# Patient Record
Sex: Male | Born: 1950 | Race: White | Hispanic: No | Marital: Single | Smoking: Former smoker
Health system: Southern US, Community
[De-identification: ages and names within clinical notes are randomized; demographics above are authoritative.]

## PROBLEM LIST (undated history)

## (undated) DIAGNOSIS — F419 Anxiety disorder, unspecified: Secondary | ICD-10-CM

## (undated) DIAGNOSIS — F32A Depression, unspecified: Secondary | ICD-10-CM

## (undated) DIAGNOSIS — F845 Asperger's syndrome: Secondary | ICD-10-CM

## (undated) DIAGNOSIS — F329 Major depressive disorder, single episode, unspecified: Secondary | ICD-10-CM

## (undated) DIAGNOSIS — J302 Other seasonal allergic rhinitis: Secondary | ICD-10-CM

## (undated) DIAGNOSIS — M1990 Unspecified osteoarthritis, unspecified site: Secondary | ICD-10-CM

## (undated) DIAGNOSIS — K5190 Ulcerative colitis, unspecified, without complications: Secondary | ICD-10-CM

## (undated) DIAGNOSIS — T7840XA Allergy, unspecified, initial encounter: Secondary | ICD-10-CM

## (undated) DIAGNOSIS — G4700 Insomnia, unspecified: Secondary | ICD-10-CM

## (undated) DIAGNOSIS — H269 Unspecified cataract: Secondary | ICD-10-CM

## (undated) HISTORY — PX: WRIST FRACTURE SURGERY: SHX121

## (undated) HISTORY — DX: Allergy, unspecified, initial encounter: T78.40XA

## (undated) HISTORY — DX: Anxiety disorder, unspecified: F41.9

## (undated) HISTORY — DX: Insomnia, unspecified: G47.00

## (undated) HISTORY — PX: POLYPECTOMY: SHX149

## (undated) HISTORY — PX: TONSILLECTOMY: SUR1361

## (undated) HISTORY — PX: COLONOSCOPY: SHX174

## (undated) HISTORY — DX: Other seasonal allergic rhinitis: J30.2

## (undated) HISTORY — DX: Major depressive disorder, single episode, unspecified: F32.9

## (undated) HISTORY — DX: Unspecified osteoarthritis, unspecified site: M19.90

## (undated) HISTORY — DX: Ulcerative colitis, unspecified, without complications: K51.90

## (undated) HISTORY — PX: HERNIA REPAIR: SHX51

## (undated) HISTORY — DX: Asperger's syndrome: F84.5

## (undated) HISTORY — DX: Depression, unspecified: F32.A

## (undated) HISTORY — DX: Unspecified cataract: H26.9

---

## 2004-08-05 ENCOUNTER — Ambulatory Visit: Payer: Self-pay | Admitting: Gastroenterology

## 2009-02-01 ENCOUNTER — Ambulatory Visit: Payer: Self-pay | Admitting: Gastroenterology

## 2009-05-19 ENCOUNTER — Emergency Department (HOSPITAL_COMMUNITY): Admission: EM | Admit: 2009-05-19 | Discharge: 2009-05-19 | Payer: Self-pay | Admitting: Emergency Medicine

## 2014-09-11 ENCOUNTER — Ambulatory Visit (INDEPENDENT_AMBULATORY_CARE_PROVIDER_SITE_OTHER): Payer: BC Managed Care – PPO

## 2014-09-11 ENCOUNTER — Ambulatory Visit (INDEPENDENT_AMBULATORY_CARE_PROVIDER_SITE_OTHER): Payer: BC Managed Care – PPO | Admitting: Podiatry

## 2014-09-11 ENCOUNTER — Encounter: Payer: Self-pay | Admitting: Podiatry

## 2014-09-11 VITALS — BP 129/84 | HR 77 | Resp 12

## 2014-09-11 DIAGNOSIS — M79672 Pain in left foot: Secondary | ICD-10-CM

## 2014-09-11 DIAGNOSIS — M722 Plantar fascial fibromatosis: Secondary | ICD-10-CM

## 2014-09-11 MED ORDER — TRIAMCINOLONE ACETONIDE 10 MG/ML IJ SUSP: 10.0000 mg | Freq: Once | INTRAMUSCULAR | Status: DC

## 2014-09-11 NOTE — Patient Instructions (Signed)
Plantar Fasciitis (Heel Spur Syndrome) with Rehab The plantar fascia is a fibrous, ligament-like, soft-tissue structure that spans the bottom of the foot. Plantar fasciitis is a condition that causes pain in the foot due to inflammation of the tissue. SYMPTOMS   Pain and tenderness on the underneath side of the foot.  Pain that worsens with standing or walking. CAUSES  Plantar fasciitis is caused by irritation and injury to the plantar fascia on the underneath side of the foot. Common mechanisms of injury include:  Direct trauma to bottom of the foot.  Damage to a small nerve that runs under the foot where the main fascia attaches to the heel bone.  Stress placed on the plantar fascia due to bone spurs. RISK INCREASES WITH:   Activities that place stress on the plantar fascia (running, jumping, pivoting, or cutting).  Poor strength and flexibility.  Improperly fitted shoes.  Tight calf muscles.  Flat feet.  Failure to warm-up properly before activity.  Obesity. PREVENTION  Warm up and stretch properly before activity.  Allow for adequate recovery between workouts.  Maintain physical fitness:  Strength, flexibility, and endurance.  Cardiovascular fitness.  Maintain a health body weight.  Avoid stress on the plantar fascia.  Wear properly fitted shoes, including arch supports for individuals who have flat feet. PROGNOSIS  If treated properly, then the symptoms of plantar fasciitis usually resolve without surgery. However, occasionally surgery is necessary. RELATED COMPLICATIONS   Recurrent symptoms that may result in a chronic condition.  Problems of the lower back that are caused by compensating for the injury, such as limping.  Pain or weakness of the foot during push-off following surgery.  Chronic inflammation, scarring, and partial or complete fascia tear, occurring more often from repeated injections. TREATMENT  Treatment initially involves the use of  ice and medication to help reduce pain and inflammation. The use of strengthening and stretching exercises may help reduce pain with activity, especially stretches of the Achilles tendon. These exercises may be performed at home or with a therapist. Your caregiver may recommend that you use heel cups of arch supports to help reduce stress on the plantar fascia. Occasionally, corticosteroid injections are given to reduce inflammation. If symptoms persist for greater than 6 months despite non-surgical (conservative), then surgery may be recommended.  MEDICATION   If pain medication is necessary, then nonsteroidal anti-inflammatory medications, such as aspirin and ibuprofen, or other minor pain relievers, such as acetaminophen, are often recommended.  Do not take pain medication within 7 days before surgery.  Prescription pain relievers may be given if deemed necessary by your caregiver. Use only as directed and only as much as you need.  Corticosteroid injections may be given by your caregiver. These injections should be reserved for the most serious cases, because they may only be given a certain number of times. HEAT AND COLD  Cold treatment (icing) relieves pain and reduces inflammation. Cold treatment should be applied for 10 to 15 minutes every 2 to 3 hours for inflammation and pain and immediately after any activity that aggravates your symptoms. Use ice packs or massage the area with a piece of ice (ice massage).  Heat treatment may be used prior to performing the stretching and strengthening activities prescribed by your caregiver, physical therapist, or athletic trainer. Use a heat pack or soak the injury in warm water. SEEK IMMEDIATE MEDICAL CARE IF:  Treatment seems to offer no benefit, or the condition worsens.  Any medications produce adverse side effects. EXERCISES RANGE   OF MOTION (ROM) AND STRETCHING EXERCISES - Plantar Fasciitis (Heel Spur Syndrome) These exercises may help you  when beginning to rehabilitate your injury. Your symptoms may resolve with or without further involvement from your physician, physical therapist or athletic trainer. While completing these exercises, remember:   Restoring tissue flexibility helps normal motion to return to the joints. This allows healthier, less painful movement and activity.  An effective stretch should be held for at least 30 seconds.  A stretch should never be painful. You should only feel a gentle lengthening or release in the stretched tissue. RANGE OF MOTION - Toe Extension, Flexion  Sit with your right / left leg crossed over your opposite knee.  Grasp your toes and gently pull them back toward the top of your foot. You should feel a stretch on the bottom of your toes and/or foot.  Hold this stretch for __________ seconds.  Now, gently pull your toes toward the bottom of your foot. You should feel a stretch on the top of your toes and or foot.  Hold this stretch for __________ seconds. Repeat __________ times. Complete this stretch __________ times per day.  RANGE OF MOTION - Ankle Dorsiflexion, Active Assisted  Remove shoes and sit on a chair that is preferably not on a carpeted surface.  Place right / left foot under knee. Extend your opposite leg for support.  Keeping your heel down, slide your right / left foot back toward the chair until you feel a stretch at your ankle or calf. If you do not feel a stretch, slide your bottom forward to the edge of the chair, while still keeping your heel down.  Hold this stretch for __________ seconds. Repeat __________ times. Complete this stretch __________ times per day.  STRETCH - Gastroc, Standing  Place hands on wall.  Extend right / left leg, keeping the front knee somewhat bent.  Slightly point your toes inward on your back foot.  Keeping your right / left heel on the floor and your knee straight, shift your weight toward the wall, not allowing your back to  arch.  You should feel a gentle stretch in the right / left calf. Hold this position for __________ seconds. Repeat __________ times. Complete this stretch __________ times per day. STRETCH - Soleus, Standing  Place hands on wall.  Extend right / left leg, keeping the other knee somewhat bent.  Slightly point your toes inward on your back foot.  Keep your right / left heel on the floor, bend your back knee, and slightly shift your weight over the back leg so that you feel a gentle stretch deep in your back calf.  Hold this position for __________ seconds. Repeat __________ times. Complete this stretch __________ times per day. STRETCH - Gastrocsoleus, Standing  Note: This exercise can place a lot of stress on your foot and ankle. Please complete this exercise only if specifically instructed by your caregiver.   Place the ball of your right / left foot on a step, keeping your other foot firmly on the same step.  Hold on to the wall or a rail for balance.  Slowly lift your other foot, allowing your body weight to press your heel down over the edge of the step.  You should feel a stretch in your right / left calf.  Hold this position for __________ seconds.  Repeat this exercise with a slight bend in your right / left knee. Repeat __________ times. Complete this stretch __________ times per day.    STRENGTHENING EXERCISES - Plantar Fasciitis (Heel Spur Syndrome)  These exercises may help you when beginning to rehabilitate your injury. They may resolve your symptoms with or without further involvement from your physician, physical therapist or athletic trainer. While completing these exercises, remember:   Muscles can gain both the endurance and the strength needed for everyday activities through controlled exercises.  Complete these exercises as instructed by your physician, physical therapist or athletic trainer. Progress the resistance and repetitions only as guided. STRENGTH -  Towel Curls  Sit in a chair positioned on a non-carpeted surface.  Place your foot on a towel, keeping your heel on the floor.  Pull the towel toward your heel by only curling your toes. Keep your heel on the floor.  If instructed by your physician, physical therapist or athletic trainer, add ____________________ at the end of the towel. Repeat __________ times. Complete this exercise __________ times per day. STRENGTH - Ankle Inversion  Secure one end of a rubber exercise band/tubing to a fixed object (table, pole). Loop the other end around your foot just before your toes.  Place your fists between your knees. This will focus your strengthening at your ankle.  Slowly, pull your big toe up and in, making sure the band/tubing is positioned to resist the entire motion.  Hold this position for __________ seconds.  Have your muscles resist the band/tubing as it slowly pulls your foot back to the starting position. Repeat __________ times. Complete this exercises __________ times per day.  Document Released: 06/19/2005 Document Revised: 09/11/2011 Document Reviewed: 10/01/2008 [employer] Patient Information 2015 [employer]. This information is not intended to replace advice given to you by your health care provider. Make sure you discuss any questions you have with your health care provider.  

## 2014-09-11 NOTE — Progress Notes (Signed)
   Subjective:    Patient ID: Scott Armstrong, male    DOB: 03-08-1951, 64 y.o.   MRN: [id number]  HPI 64 year old male persists the office today with complaints of left heel pain which has been ongoing for approximately one month. He states he has pain particularly in the morning which is relieved by ambulation. He denies any history of injury or trauma to the area and denies any change increase in activity at the time of onset of symptoms. He denies any numbness or tingling. The pain does not wake him up at night. He denies any swelling or redness overlying the area. He has been applying heat to the area without much relief. No other complaints at this time.  Review of Systems  Eyes: Positive for visual disturbance.  All other systems reviewed and are negative.      Objective:   Physical Exam  AAO x3, NAD DP/PT pulses palpable bilaterally, CRT less than 3 seconds Protective sensation intact with Simms Weinstein monofilament, vibratory sensation intact, Achilles tendon reflex intact Tenderness to palpation overlying the plantar medial tubercle of the calcaneus to left heel at the insertion of the plantar fascia. There is no pain on the contralateral extremity. There is no pain along the course of plantar fascial within the arch of the foot and the plantar fascia appears intact. There is no pain with lateral compression of the calcaneus or pain the vibratory sensation. No pain on the posterior aspect of the calcaneus or along the course/insertion of the Achilles tendon. There is no overlying edema, erythema, increase in warmth. No other areas of tenderness palpation or pain with vibratory sensation to the foot/ankle. MMT 5/5, ROM WNL No open lesions or pre-ulcerative lesions are identified. No pain with calf compression, swelling, warmth, erythema.      Assessment & Plan:   64 year old male with left heel pain, likely plantar fasciitis -X-rays were obtained and reviewed with the  patient. -Treatment options were discussed with the patient going alternatives, risks, complications. -Patient elects to proceed with steroid injection into the left heel. Under sterile skin preparation, a total of 2.5cc of kenalog 10, 0.5% Marcaine plain, and 2% lidocaine plain were infiltrated into the symptomatic area without complication. A band-aid was applied. Patient tolerated the injection well without complication. Post-injection care with discussed with the patient. Discussed with the patient to ice the area over the next couple of days to help prevent a steroid flare.  -Plantar fascial brace was dispensed -Ice to the area -Discussed stretching exercises. States that he currently is gone to physical therapy for another issue. Her current him to discuss this with his therapist tomorrow. -Discussed shoe gear modification and possible orthotics. -Follow-up in 3 weeks or sooner if any problems are to arise. In the meantime encouraged to call the office with any questions, concerns, change in symptoms.

## 2014-09-15 ENCOUNTER — Encounter: Payer: Self-pay | Admitting: Podiatry

## 2014-10-02 ENCOUNTER — Ambulatory Visit (INDEPENDENT_AMBULATORY_CARE_PROVIDER_SITE_OTHER): Payer: BC Managed Care – PPO | Admitting: Podiatry

## 2014-10-02 ENCOUNTER — Encounter: Payer: Self-pay | Admitting: Podiatry

## 2014-10-02 VITALS — BP 138/92 | HR 85 | Resp 12

## 2014-10-02 DIAGNOSIS — M722 Plantar fascial fibromatosis: Secondary | ICD-10-CM

## 2014-10-02 MED ORDER — TRIAMCINOLONE ACETONIDE 10 MG/ML IJ SUSP
10.0000 mg | Freq: Once | INTRAMUSCULAR | Status: AC
  Administered 2014-10-02: 10 mg

## 2014-10-02 NOTE — Patient Instructions (Signed)
Plantar Fasciitis (Heel Spur Syndrome) with Rehab The plantar fascia is a fibrous, ligament-like, soft-tissue structure that spans the bottom of the foot. Plantar fasciitis is a condition that causes pain in the foot due to inflammation of the tissue. SYMPTOMS   Pain and tenderness on the underneath side of the foot.  Pain that worsens with standing or walking. CAUSES  Plantar fasciitis is caused by irritation and injury to the plantar fascia on the underneath side of the foot. Common mechanisms of injury include:  Direct trauma to bottom of the foot.  Damage to a small nerve that runs under the foot where the main fascia attaches to the heel bone.  Stress placed on the plantar fascia due to bone spurs. RISK INCREASES WITH:   Activities that place stress on the plantar fascia (running, jumping, pivoting, or cutting).  Poor strength and flexibility.  Improperly fitted shoes.  Tight calf muscles.  Flat feet.  Failure to warm-up properly before activity.  Obesity. PREVENTION  Warm up and stretch properly before activity.  Allow for adequate recovery between workouts.  Maintain physical fitness:  Strength, flexibility, and endurance.  Cardiovascular fitness.  Maintain a health body weight.  Avoid stress on the plantar fascia.  Wear properly fitted shoes, including arch supports for individuals who have flat feet. PROGNOSIS  If treated properly, then the symptoms of plantar fasciitis usually resolve without surgery. However, occasionally surgery is necessary. RELATED COMPLICATIONS   Recurrent symptoms that may result in a chronic condition.  Problems of the lower back that are caused by compensating for the injury, such as limping.  Pain or weakness of the foot during push-off following surgery.  Chronic inflammation, scarring, and partial or complete fascia tear, occurring more often from repeated injections. TREATMENT  Treatment initially involves the use of  ice and medication to help reduce pain and inflammation. The use of strengthening and stretching exercises may help reduce pain with activity, especially stretches of the Achilles tendon. These exercises may be performed at home or with a therapist. Your caregiver may recommend that you use heel cups of arch supports to help reduce stress on the plantar fascia. Occasionally, corticosteroid injections are given to reduce inflammation. If symptoms persist for greater than 6 months despite non-surgical (conservative), then surgery may be recommended.  MEDICATION   If pain medication is necessary, then nonsteroidal anti-inflammatory medications, such as aspirin and ibuprofen, or other minor pain relievers, such as acetaminophen, are often recommended.  Do not take pain medication within 7 days before surgery.  Prescription pain relievers may be given if deemed necessary by your caregiver. Use only as directed and only as much as you need.  Corticosteroid injections may be given by your caregiver. These injections should be reserved for the most serious cases, because they may only be given a certain number of times. HEAT AND COLD  Cold treatment (icing) relieves pain and reduces inflammation. Cold treatment should be applied for 10 to 15 minutes every 2 to 3 hours for inflammation and pain and immediately after any activity that aggravates your symptoms. Use ice packs or massage the area with a piece of ice (ice massage).  Heat treatment may be used prior to performing the stretching and strengthening activities prescribed by your caregiver, physical therapist, or athletic trainer. Use a heat pack or soak the injury in warm water. SEEK IMMEDIATE MEDICAL CARE IF:  Treatment seems to offer no benefit, or the condition worsens.  Any medications produce adverse side effects. EXERCISES RANGE   OF MOTION (ROM) AND STRETCHING EXERCISES - Plantar Fasciitis (Heel Spur Syndrome) These exercises may help you  when beginning to rehabilitate your injury. Your symptoms may resolve with or without further involvement from your physician, physical therapist or athletic trainer. While completing these exercises, remember:   Restoring tissue flexibility helps normal motion to return to the joints. This allows healthier, less painful movement and activity.  An effective stretch should be held for at least 30 seconds.  A stretch should never be painful. You should only feel a gentle lengthening or release in the stretched tissue. RANGE OF MOTION - Toe Extension, Flexion  Sit with your right / left leg crossed over your opposite knee.  Grasp your toes and gently pull them back toward the top of your foot. You should feel a stretch on the bottom of your toes and/or foot.  Hold this stretch for __________ seconds.  Now, gently pull your toes toward the bottom of your foot. You should feel a stretch on the top of your toes and or foot.  Hold this stretch for __________ seconds. Repeat __________ times. Complete this stretch __________ times per day.  RANGE OF MOTION - Ankle Dorsiflexion, Active Assisted  Remove shoes and sit on a chair that is preferably not on a carpeted surface.  Place right / left foot under knee. Extend your opposite leg for support.  Keeping your heel down, slide your right / left foot back toward the chair until you feel a stretch at your ankle or calf. If you do not feel a stretch, slide your bottom forward to the edge of the chair, while still keeping your heel down.  Hold this stretch for __________ seconds. Repeat __________ times. Complete this stretch __________ times per day.  STRETCH - Gastroc, Standing  Place hands on wall.  Extend right / left leg, keeping the front knee somewhat bent.  Slightly point your toes inward on your back foot.  Keeping your right / left heel on the floor and your knee straight, shift your weight toward the wall, not allowing your back to  arch.  You should feel a gentle stretch in the right / left calf. Hold this position for __________ seconds. Repeat __________ times. Complete this stretch __________ times per day. STRETCH - Soleus, Standing  Place hands on wall.  Extend right / left leg, keeping the other knee somewhat bent.  Slightly point your toes inward on your back foot.  Keep your right / left heel on the floor, bend your back knee, and slightly shift your weight over the back leg so that you feel a gentle stretch deep in your back calf.  Hold this position for __________ seconds. Repeat __________ times. Complete this stretch __________ times per day. STRETCH - Gastrocsoleus, Standing  Note: This exercise can place a lot of stress on your foot and ankle. Please complete this exercise only if specifically instructed by your caregiver.   Place the ball of your right / left foot on a step, keeping your other foot firmly on the same step.  Hold on to the wall or a rail for balance.  Slowly lift your other foot, allowing your body weight to press your heel down over the edge of the step.  You should feel a stretch in your right / left calf.  Hold this position for __________ seconds.  Repeat this exercise with a slight bend in your right / left knee. Repeat __________ times. Complete this stretch __________ times per day.    STRENGTHENING EXERCISES - Plantar Fasciitis (Heel Spur Syndrome)  These exercises may help you when beginning to rehabilitate your injury. They may resolve your symptoms with or without further involvement from your physician, physical therapist or athletic trainer. While completing these exercises, remember:   Muscles can gain both the endurance and the strength needed for everyday activities through controlled exercises.  Complete these exercises as instructed by your physician, physical therapist or athletic trainer. Progress the resistance and repetitions only as guided. STRENGTH -  Towel Curls  Sit in a chair positioned on a non-carpeted surface.  Place your foot on a towel, keeping your heel on the floor.  Pull the towel toward your heel by only curling your toes. Keep your heel on the floor.  If instructed by your physician, physical therapist or athletic trainer, add ____________________ at the end of the towel. Repeat __________ times. Complete this exercise __________ times per day. STRENGTH - Ankle Inversion  Secure one end of a rubber exercise band/tubing to a fixed object (table, pole). Loop the other end around your foot just before your toes.  Place your fists between your knees. This will focus your strengthening at your ankle.  Slowly, pull your big toe up and in, making sure the band/tubing is positioned to resist the entire motion.  Hold this position for __________ seconds.  Have your muscles resist the band/tubing as it slowly pulls your foot back to the starting position. Repeat __________ times. Complete this exercises __________ times per day.  Document Released: 06/19/2005 Document Revised: 09/11/2011 Document Reviewed: 10/01/2008 [employer] Patient Information 2015 [employer]. This information is not intended to replace advice given to you by your health care provider. Make sure you discuss any questions you have with your health care provider.  

## 2014-10-04 ENCOUNTER — Encounter: Payer: Self-pay | Admitting: Podiatry

## 2014-10-04 NOTE — Progress Notes (Signed)
Patient ID: Scott Armstrong, male   DOB: May 01, 1951, 64 y.o.   MRN: [id number]  Subjective: 64 year old male presents the office today. Evaluation of left heel pain, plantar fasciitis. He states that since last appointment is been continue the stretching, icing, plantar fascial brace. He states that since last appointment he does have some resolution of symptoms although he discontinued have some mild intermittent discomfort to the heel. Denies any swelling or redness. Denies any numbness or tingling. No other complaints at this time.  Objective: AAO x3, NAD DP/PT pulses palpable bilaterally, CRT less than 3 seconds Protective sensation intact with Simms Weinstein monofilament, vibratory sensation intact, Achilles tendon reflex intact There is mild tenderness to palpation overlying the plantar medial tubercle of the calcaneus to left heel at the insertion of the plantar fascia, which appears to be improved compared to last appointment. There is no pain along the course of plantar fascial within the arch of the foot and the plantar fascia appears intact. There is no pain with lateral compression of the calcaneus or pain the vibratory sensation. No pain on the posterior aspect of the calcaneus or along the course/insertion of the Achilles tendon. There is no overlying edema, erythema, increase in warmth. No other areas of tenderness palpation or pain with vibratory sensation to the foot/ankle. MMT 5/5, ROM WNL No open lesions or pre-ulcerative lesions are identified. No pain with calf compression, swelling, warmth, erythema.  Assessment: 64 year old male with resolving left heel pain, plantar fasciitis  Plan: -Patient elects to proceed with steroid injection into the left heel. Under sterile skin preparation, a total of 2.5cc of kenalog 10, 0.5% Marcaine plain, and 2% lidocaine plain were infiltrated into the symptomatic area without complication. A band-aid was applied. Patient tolerated the injection  well without complication. Post-injection care with discussed with the patient. Discussed with the patient to ice the area over the next couple of days to help prevent a steroid flare.  -Plantar fascial taping was applied. Once they was removed he can continue with the plantar fascial brace. -Continue stretching activities -Ice to the area -Discussed shoe gear modifications not to go barefoot. Also discuss orthotics. -Follow-up in 3 weeks or sooner if any problems are to arise. In the meantime encouraged to call the office with any questions, concerns, change in symptoms.

## 2014-10-21 ENCOUNTER — Ambulatory Visit (INDEPENDENT_AMBULATORY_CARE_PROVIDER_SITE_OTHER): Payer: BC Managed Care – PPO | Admitting: Podiatry

## 2014-10-21 ENCOUNTER — Encounter: Payer: Self-pay | Admitting: Podiatry

## 2014-10-21 VITALS — BP 103/74 | HR 85 | Resp 18

## 2014-10-21 DIAGNOSIS — M722 Plantar fascial fibromatosis: Secondary | ICD-10-CM | POA: Diagnosis not present

## 2014-10-23 ENCOUNTER — Encounter: Payer: Self-pay | Admitting: Podiatry

## 2014-10-23 NOTE — Progress Notes (Signed)
Patient ID: Scott Armstrong, male   DOB: [birth year], 64 y.o.   MRN: [id number]  Subjective: Patient returns the office they for follow up evaluation of left heel pain, plantar fasciitis. He states that he continues to be much improved compared to what he was previously. He continues his stretching, icing, plantar fascial brace. He does continue gets some mild intermittent discomfort at time however it is much improved. Denies a numbness or tingling. Denies any swelling or redness. No other complaints at this time. No acute changes since last appointment.  Objective: AAO x3, NAD DP/PT pulses palpable bilaterally, CRT less than 3 seconds Protective sensation intact with Simms Weinstein monofilament, vibratory sensation intact, Achilles tendon reflex intact There is no tenderness to palpation overlying the plantar medial tubercle of the calcaneus to left heel at the insertion of the plantar fascia. There is no pain along the course of plantar fascial within the arch of the foot. There is no pain with lateral compression of the calcaneus or pain the vibratory sensation. No pain on the posterior aspect of the calcaneus or along the course/insertion of the Achilles tendon. There is no overlying edema, erythema, increase in warmth. No other areas of tenderness palpation or pain with vibratory sensation to the foot/ankle. MMT 5/5, ROM WNL No open lesions or pre-ulcerative lesions are identified. No pain with calf compression, swelling, warmth, erythema.  Assessment: 64 year old male with resolving plantar fasciitis heel pain.  Plan: -Treatment options were discussed included alternatives, risks, complications. -At this time as his symptoms this greatly improved we'll hold off on a third steroid injection. -Discussed orthotics to help control his foot type and help prevent recurrence. The patient was scanned for orthotics and they were sent to [facility] labs. -Continue icing activities as well as  stretching. -Shoe gear modifications -Follow-up after orthotics arrive or sooner if any problems are to arise. Call if questions or concerns.

## 2014-11-11 ENCOUNTER — Ambulatory Visit: Payer: BC Managed Care – PPO | Admitting: *Deleted

## 2014-11-11 DIAGNOSIS — M722 Plantar fascial fibromatosis: Secondary | ICD-10-CM

## 2014-11-11 NOTE — Patient Instructions (Signed)

## 2014-11-11 NOTE — Progress Notes (Signed)
Patient ID: Scott Armstrong, male   DOB: 1950-09-25, 64 y.o.   MRN: [id number] PICKING UP INSERTS

## 2015-02-28 ENCOUNTER — Emergency Department (HOSPITAL_COMMUNITY)
Admission: EM | Admit: 2015-02-28 | Discharge: 2015-02-28 | Disposition: A | Discharge status: Home / Self Care | Payer: Medicare HMO | Attending: Emergency Medicine | Admitting: Emergency Medicine

## 2015-02-28 DIAGNOSIS — F329 Major depressive disorder, single episode, unspecified: Secondary | ICD-10-CM | POA: Insufficient documentation

## 2015-02-28 DIAGNOSIS — R531 Weakness: Secondary | ICD-10-CM | POA: Diagnosis not present

## 2015-02-28 DIAGNOSIS — R42 Dizziness and giddiness: Secondary | ICD-10-CM | POA: Diagnosis present

## 2015-02-28 DIAGNOSIS — R824 Acetonuria: Secondary | ICD-10-CM | POA: Diagnosis not present

## 2015-02-28 LAB — URINALYSIS, ROUTINE W REFLEX MICROSCOPIC
GLUCOSE, UA: NEGATIVE mg/dL
HGB URINE DIPSTICK: NEGATIVE
KETONES UR: 15 mg/dL — AB
Leukocytes, UA: NEGATIVE
NITRITE: NEGATIVE
PH: 5.5 (ref 5.0–8.0)
Protein, ur: 30 mg/dL — AB
SPECIFIC GRAVITY, URINE: 1.026 (ref 1.005–1.030)
Urobilinogen, UA: 1 mg/dL (ref 0.0–1.0)

## 2015-02-28 LAB — URINE MICROSCOPIC-ADD ON

## 2015-02-28 LAB — COMPREHENSIVE METABOLIC PANEL
ALK PHOS: 387 U/L — AB (ref 38–126)
ALT: 118 U/L — AB (ref 17–63)
ANION GAP: 10 (ref 5–15)
AST: 75 U/L — ABNORMAL HIGH (ref 15–41)
Albumin: 2.5 g/dL — ABNORMAL LOW (ref 3.5–5.0)
BILIRUBIN TOTAL: 0.8 mg/dL (ref 0.3–1.2)
BUN: 12 mg/dL (ref 6–20)
CALCIUM: 8.8 mg/dL — AB (ref 8.9–10.3)
CO2: 28 mmol/L (ref 22–32)
CREATININE: 0.91 mg/dL (ref 0.61–1.24)
Chloride: 100 mmol/L — ABNORMAL LOW (ref 101–111)
GFR calc non Af Amer: >60 mL/min (ref >60)
GLUCOSE: 128 mg/dL — AB (ref 65–99)
Potassium: 4.2 mmol/L (ref 3.5–5.1)
Sodium: 138 mmol/L (ref 135–145)
TOTAL PROTEIN: 6.6 g/dL (ref 6.5–8.1)

## 2015-02-28 LAB — CBC WITH DIFFERENTIAL/PLATELET
Basophils Absolute: 0 10*3/uL (ref 0.0–0.1)
Basophils Relative: 0 % (ref 0–1)
EOS ABS: 0.1 10*3/uL (ref 0.0–0.7)
EOS PCT: 1 % (ref 0–5)
HCT: 38.5 % — ABNORMAL LOW (ref 39.0–52.0)
Hemoglobin: 12.7 g/dL — ABNORMAL LOW (ref 13.0–17.0)
LYMPHS ABS: 0.7 10*3/uL (ref 0.7–4.0)
LYMPHS PCT: 10 % — AB (ref 12–46)
MCH: 30.6 pg (ref 26.0–34.0)
MCHC: 33 g/dL (ref 30.0–36.0)
MCV: 92.8 fL (ref 78.0–100.0)
MONO ABS: 0.7 10*3/uL (ref 0.1–1.0)
Monocytes Relative: 10 % (ref 3–12)
Neutro Abs: 5.4 10*3/uL (ref 1.7–7.7)
Neutrophils Relative %: 79 % — ABNORMAL HIGH (ref 43–77)
PLATELETS: 412 10*3/uL — AB (ref 150–400)
RBC: 4.15 MIL/uL — AB (ref 4.22–5.81)
RDW: 13.3 % (ref 11.5–15.5)
WBC: 6.8 10*3/uL (ref 4.0–10.5)

## 2015-02-28 LAB — I-STAT CG4 LACTIC ACID, ED: LACTIC ACID, VENOUS: 1.07 mmol/L (ref 0.5–2.0)

## 2015-02-28 LAB — TROPONIN I

## 2015-02-28 LAB — LIPASE, BLOOD: Lipase: 16 U/L — ABNORMAL LOW (ref 22–51)

## 2015-02-28 MED ORDER — SODIUM CHLORIDE 0.9 % IV BOLUS (SEPSIS)
1000.0000 mL | Freq: Once | INTRAVENOUS | Status: AC
  Administered 2015-02-28: 1000 mL via INTRAVENOUS

## 2015-02-28 NOTE — ED Notes (Signed)
Patient given urinal and advised of need for specimen

## 2015-02-28 NOTE — ED Provider Notes (Signed)
CSN: [id number]     Arrival date & time 02/28/15  1349 History   First MD Initiated Contact with Patient 02/28/15 1350     Chief Complaint  Patient presents with  . Dizziness     (Consider location/radiation/quality/duration/timing/severity/associated sxs/prior Treatment) HPI  Patient presents with 2 weeks of generalized weakness. Notably, the patient was more profoundly ill 1 week ago, during a period that included nausea, vomiting, diarrhea. Over the interval week he has been progressively better, though earlier today felt a recurrence of weakness, diaphoresis. No additional vomiting, diarrhea, and the patient had a solid bowel movement for the first time earlier today. No fever throughout the past few days, confusion, disorders, chest pain, dyspnea. He has a history of ulcerative colitis, states that he is otherwise well.   Past Medical History  Diagnosis Date  . Depression    No past surgical history on file. No family history on file. Social History  Substance Use Topics  . Smoking status: Never Smoker   . Smokeless tobacco: Not on file  . Alcohol Use: No    Review of Systems  Constitutional:       Per HPI, otherwise negative  HENT:       Per HPI, otherwise negative  Respiratory:       Per HPI, otherwise negative  Cardiovascular:       Per HPI, otherwise negative  Gastrointestinal: Negative for vomiting.  Endocrine:       Negative aside from HPI  Genitourinary:       Neg aside from HPI   Musculoskeletal:       Per HPI, otherwise negative  Skin: Negative.   Neurological: Positive for weakness. Negative for syncope.      Allergies  Review of patient's allergies indicates no known allergies.  Home Medications   Prior to Admission medications   Medication Sig Start Date End Date Taking? Authorizing Provider  DELZICOL 400 MG CPDR DR capsule  08/31/14   Historical Provider, MD  escitalopram (LEXAPRO) 10 MG tablet  07/21/14   Historical Provider, MD   BP  134/90 mmHg  Pulse 90  Temp(Src) 97.5 F (36.4 C)  Resp 16  SpO2 97% Physical Exam  Constitutional: He is oriented to person, place, and time. He appears well-developed. No distress.  HENT:  Head: Normocephalic and atraumatic.  Eyes: Conjunctivae and EOM are normal.  Cardiovascular: Normal rate and regular rhythm.   Pulmonary/Chest: Effort normal. No stridor. No respiratory distress.  Abdominal: He exhibits no distension. There is no tenderness. There is no rebound and no guarding.  Musculoskeletal: He exhibits no edema.  Neurological: He is alert and oriented to person, place, and time.  Skin: Skin is warm and dry.  Psychiatric: He has a normal mood and affect.  Nursing note and vitals reviewed.   ED Course  Procedures (including critical care time) Labs Review Labs Reviewed  COMPREHENSIVE METABOLIC PANEL - Abnormal; Notable for the following:    Chloride 100 (*)    Glucose, Bld 128 (*)    Calcium 8.8 (*)    Albumin 2.5 (*)    AST 75 (*)    ALT 118 (*)    Alkaline Phosphatase 387 (*)    All other components within normal limits  LIPASE, BLOOD - Abnormal; Notable for the following:    Lipase 16 (*)    All other components within normal limits  CBC WITH DIFFERENTIAL/PLATELET - Abnormal; Notable for the following:    RBC 4.15 (*)  Hemoglobin 12.7 (*)    HCT 38.5 (*)    Platelets 412 (*)    Neutrophils Relative % 79 (*)    Lymphocytes Relative 10 (*)    All other components within normal limits  TROPONIN I  URINALYSIS, ROUTINE W REFLEX MICROSCOPIC (NOT AT [facility]  I-STAT CG4 LACTIC ACID, ED    Imaging Review No results found. I have personally reviewed and evaluated these images and lab results as part of my medical decision-making.   EKG Interpretation   Date/Time:  Sunday February 28 2015 13:57:28 EDT Ventricular Rate:  85 PR Interval:  184 QRS Duration: 108 QT Interval:  406 QTC Calculation: 483 R Axis:   -55 Text Interpretation:  Sinus rhythm LAD,  consider left anterior fascicular  block Low voltage, extremity and precordial leads Borderline prolonged QT  interval Sinus rhythm Low voltage QRS Artifact T wave abnormality Abnormal  ekg Confirmed by Carmin Muskrat  MD [phone]) on 02/28/2015 2:14:07 PM     On repeat exam the patient is well-appearing MDM  Patient presents after 2 weeks of generalized illness, but is generally improving, by his account. Here the patient's labs are largely reassuring, though consistent with hepatic dysfunction, possibly resolving.  No hyperbilirubinemia or e/o acute hepatic failure, no e/o renal failure.  Patient improved with fluid resuscitation, was discharged in stable condition to follow up with his gastroenterologist.    Carmin Muskrat, MD 02/28/15 [phone]

## 2015-02-28 NOTE — Discharge Instructions (Signed)
As discussed, your evaluation today has been largely reassuring.  But, it is important that you monitor your condition carefully, and do not hesitate to return to the ED if you develop new, or concerning changes in your condition. ? ?Otherwise, please follow-up with your physician for appropriate ongoing care. ? ?

## 2015-02-28 NOTE — ED Notes (Signed)
MD at bedside. 

## 2015-02-28 NOTE — ED Notes (Addendum)
Pt arrives [facility] from [facility]. Pt reports n/v/d that began 2 weeks ago, felt better last week, states today he felt dizzy, weak and had some diaphoresis so he decided to get seen at urgent care. Pt denies any chest pain or sob. Pt alert, oriented x4. resp e/u, skin warm and dry, color WNL

## 2015-03-18 ENCOUNTER — Other Ambulatory Visit: Payer: Self-pay | Admitting: Internal Medicine

## 2015-03-18 DIAGNOSIS — R945 Abnormal results of liver function studies: Secondary | ICD-10-CM

## 2015-03-23 ENCOUNTER — Ambulatory Visit
Admission: RE | Admit: 2015-03-23 | Discharge: 2015-03-23 | Disposition: A | Discharge status: Home / Self Care | Payer: Medicare HMO | Source: Ambulatory Visit | Attending: Internal Medicine | Admitting: Internal Medicine

## 2015-03-23 DIAGNOSIS — R945 Abnormal results of liver function studies: Secondary | ICD-10-CM

## 2015-04-20 DIAGNOSIS — K7689 Other specified diseases of liver: Secondary | ICD-10-CM | POA: Diagnosis not present

## 2015-04-20 DIAGNOSIS — Z0000 Encounter for general adult medical examination without abnormal findings: Secondary | ICD-10-CM | POA: Diagnosis not present

## 2015-04-20 DIAGNOSIS — R739 Hyperglycemia, unspecified: Secondary | ICD-10-CM | POA: Diagnosis not present

## 2015-04-20 DIAGNOSIS — Z23 Encounter for immunization: Secondary | ICD-10-CM | POA: Diagnosis not present

## 2015-06-16 DIAGNOSIS — R69 Illness, unspecified: Secondary | ICD-10-CM | POA: Diagnosis not present

## 2015-07-09 DIAGNOSIS — J019 Acute sinusitis, unspecified: Secondary | ICD-10-CM | POA: Diagnosis not present

## 2015-10-13 DIAGNOSIS — Z125 Encounter for screening for malignant neoplasm of prostate: Secondary | ICD-10-CM | POA: Diagnosis not present

## 2015-10-13 DIAGNOSIS — Z0000 Encounter for general adult medical examination without abnormal findings: Secondary | ICD-10-CM | POA: Diagnosis not present

## 2015-10-13 DIAGNOSIS — R739 Hyperglycemia, unspecified: Secondary | ICD-10-CM | POA: Diagnosis not present

## 2015-10-13 DIAGNOSIS — E7800 Pure hypercholesterolemia, unspecified: Secondary | ICD-10-CM | POA: Diagnosis not present

## 2015-10-20 ENCOUNTER — Encounter: Payer: Self-pay | Admitting: Gastroenterology

## 2015-10-20 DIAGNOSIS — R739 Hyperglycemia, unspecified: Secondary | ICD-10-CM | POA: Diagnosis not present

## 2015-10-20 DIAGNOSIS — K5190 Ulcerative colitis, unspecified, without complications: Secondary | ICD-10-CM | POA: Diagnosis not present

## 2015-10-20 DIAGNOSIS — Z0000 Encounter for general adult medical examination without abnormal findings: Secondary | ICD-10-CM | POA: Diagnosis not present

## 2015-11-02 DIAGNOSIS — H2513 Age-related nuclear cataract, bilateral: Secondary | ICD-10-CM | POA: Diagnosis not present

## 2015-11-17 DIAGNOSIS — R030 Elevated blood-pressure reading, without diagnosis of hypertension: Secondary | ICD-10-CM | POA: Diagnosis not present

## 2015-12-15 DIAGNOSIS — R05 Cough: Secondary | ICD-10-CM | POA: Diagnosis not present

## 2015-12-15 DIAGNOSIS — J019 Acute sinusitis, unspecified: Secondary | ICD-10-CM | POA: Diagnosis not present

## 2015-12-27 ENCOUNTER — Telehealth: Payer: Self-pay | Admitting: Gastroenterology

## 2015-12-27 NOTE — Telephone Encounter (Signed)
Patient called me back and left me a message stating that he requested Dr. Maudie Mercury get these records several months ago and they never received them and he does not have any records at home.

## 2015-12-27 NOTE — Telephone Encounter (Signed)
Informed patient that I was trying to get the records for Dr. Fuller Plan before his appt so Dr. Fuller Plan could review them. Patient states he has tried in the past to get the records sent to his PCP and it was very frustrating that they were never sent. Patient states he will sign a ROI this Friday at his appt.

## 2015-12-27 NOTE — Telephone Encounter (Signed)
Called and left a voicemail asking patient if he has any previous GI records at home he can bring with him to his appointment on Friday or if can sign a ROI at [place] and get those to bring to his appointment.

## 2015-12-29 DIAGNOSIS — R69 Illness, unspecified: Secondary | ICD-10-CM | POA: Diagnosis not present

## 2015-12-31 ENCOUNTER — Encounter: Payer: Self-pay | Admitting: Gastroenterology

## 2015-12-31 ENCOUNTER — Ambulatory Visit (INDEPENDENT_AMBULATORY_CARE_PROVIDER_SITE_OTHER): Payer: Medicare HMO | Admitting: Gastroenterology

## 2015-12-31 VITALS — BP 120/82 | HR 88 | Ht 73.62 in | Wt 238.2 lb

## 2015-12-31 DIAGNOSIS — K5180 Other ulcerative colitis without complications: Secondary | ICD-10-CM

## 2015-12-31 NOTE — Patient Instructions (Signed)
We will get your previous Gastroenterology records.   Thank you for choosing me and [facility].  Pricilla Riffle. Dagoberto Ligas., MD., Marval Regal

## 2015-12-31 NOTE — Progress Notes (Addendum)
History of Present Illness: This is a 65 year old male referred by Scott Gravel, MD for the evaluation of ulcerative colitis. Patient relates he was diagnosed with ulcerative colitis about 10-12 years ago. He has had follow-up with Dr. Verdie Shire in [place]. Patient relates he had a colonoscopy performed about 2010 by Dr. Candace Cruise. We contacted the patient prior to his office appt today and asked him to obtain his gastroenterology records but unfortunately he was not able to do so. He is not sure of the extent of his colitis. His symptoms have been well controlled on mesalamine. His receiving routine refills mesalamine through Dr. Jani Armstrong. Blood work obtained in Dr. Julianne Rice office in April 2017 showed a normal CBC and normal comprehensive metabolic panel. Denies weight loss, abdominal pain, constipation, diarrhea, change in stool caliber, melena, hematochezia, nausea, vomiting, dysphagia, reflux symptoms, chest pain.  No Known Allergies Outpatient Prescriptions Prior to Visit  Medication Sig Dispense Refill  . aspirin EC 81 MG tablet Take 81 mg by mouth daily.    . DELZICOL 400 MG CPDR DR capsule Take 400 mg by mouth 3 (three) times daily.   11  . escitalopram (LEXAPRO) 10 MG tablet   3  . loperamide (IMODIUM A-D) 2 MG tablet Take 2 mg by mouth 4 (four) times daily as needed for diarrhea or loose stools.    . Multiple Vitamin (MULTIVITAMIN WITH MINERALS) TABS tablet Take 1 tablet by mouth daily.     Facility-Administered Medications Prior to Visit  Medication Dose Route Frequency Provider Last Rate Last Dose  . triamcinolone acetonide (KENALOG) 10 MG/ML injection 10 mg  10 mg Other Once Trula Slade, DPM       Past Medical History  Diagnosis Date  . Depression   . Anxiety   . Insomnia   . Aspergers' syndrome   . Ulcerative colitis ([place])   . Arthritis   . Seasonal allergies    Past Surgical History  Procedure Laterality Date  . Wrist fracture surgery Right   . Tonsillectomy     Social  History   Social History  . Marital Status: Single    Spouse Name: N/A  . Number of Children: 0  . Years of Education: N/A   Occupational History  . retired Pharmacist, hospital    Social History Main Topics  . Smoking status: Former Smoker -- 3 years    Types: Cigarettes    Quit date: 07/04/1971  . Smokeless tobacco: Never Used     Comment: college years  . Alcohol Use: 0.0 oz/week    0 Standard drinks or equivalent per week     Comment: 3 per day  . Drug Use: No  . Sexual Activity: Not Asked   Other Topics Concern  . None   Social History Narrative   Family History  Problem Relation Age of Onset  . Gout Father   . Renal Disease Father   . Hypertension Father   . Alzheimer's disease Mother   . Prostate cancer Father   . Heart attack Paternal Grandmother   . Heart attack Paternal Aunt   . Hypertension Sister      Review of Systems: Pertinent positive and negative review of systems were noted in the above HPI section. All other review of systems were otherwise negative.   Physical Exam: General: Well developed, well nourished, no acute distress Head: Normocephalic and atraumatic Eyes:  sclerae anicteric, EOMI Ears: Normal auditory acuity Mouth: No deformity or lesions Neck: Supple, no masses  or thyromegaly Lungs: Clear throughout to auscultation Heart: Regular rate and rhythm; no murmurs, rubs or bruits Abdomen: Soft, non tender and non distended. No masses, hepatosplenomegaly or hernias noted. Normal Bowel sounds Rectal: deferred, exam by Dr. Maudie Mercury in 10/2015 was unremarkable  Musculoskeletal: Symmetrical with no gross deformities  Skin: No lesions on visible extremities Pulses:  Normal pulses noted Extremities: No clubbing, cyanosis, edema or deformities noted Neurological: Alert oriented x 4, grossly nonfocal Cervical Nodes:  No significant cervical adenopathy Inguinal Nodes: No significant inguinal adenopathy Psychological:  Alert and cooperative. Normal mood and  affect  Assessment and Recommendations:  1. Ulcerative colitis, unspecified location, without complication. Extent of disease and duration of disease needs to be defined from prior records. Request records from Dr. Verdie Shire to determine appropriate interval for surveillance colonoscopy. Continue mesalamine 800 mg by mouth 3 times a day. REV in 1 year.   cc: Scott Gravel, MD [address] [place], [place] [id number]   Addendum, 01/13/2016. Records received and reviewed from [facility]. Colonoscopy in 03/2004 revealed inflammatory changes with tiny ulcerations, edema, hyperemia and friability in the sigmoid colon and descending colon. Pathology showed active colitis with cryptitis. He was treated with 5-ASA agents. His colitis was felt to be an indeterminate type. He underwent colonoscopy in 08/2004 with similar endoscopic findings and similar pathology. Underwent colonoscopy in August 2010 that showed mild inflammation in the sigmoid colon and biopsies were obtained however cannot locate the results. Review of office records indicate degenerative diagnosis of left-sided ulcerative colitis despite apparent rectal sparing on colonoscopies. He was treated with Asacol and during flares he was treated with prednisone. Will contact patient to schedule colonoscopy.

## 2016-01-10 ENCOUNTER — Encounter: Payer: Self-pay | Admitting: Gastroenterology

## 2016-01-13 ENCOUNTER — Telehealth: Payer: Self-pay

## 2016-01-13 NOTE — Telephone Encounter (Signed)
-----   Message from Ladene Artist, MD sent at 01/13/2016 12:45 PM EDT ----- Records from Dr. Verdie Shire and [facility] received and reviewed. Last colonoscopy was in August 2010 and he should undergo repeat colonoscopy now. Please call patient and schedule. Thx.

## 2016-01-13 NOTE — Telephone Encounter (Signed)
Notified patient he is due for Colonoscopy now and scheduled patient for Colonoscopy on 02/04/16 at 9:00am and nurse visit for 01/24/16 at 10:30am. Patient verbalized understanding.

## 2016-01-13 NOTE — Telephone Encounter (Signed)
Left a message for patient to return my call. 

## 2016-01-18 DIAGNOSIS — R030 Elevated blood-pressure reading, without diagnosis of hypertension: Secondary | ICD-10-CM | POA: Diagnosis not present

## 2016-01-24 ENCOUNTER — Encounter: Payer: Self-pay | Admitting: Gastroenterology

## 2016-01-24 ENCOUNTER — Ambulatory Visit (AMBULATORY_SURGERY_CENTER): Payer: Self-pay | Admitting: *Deleted

## 2016-01-24 VITALS — Ht 73.0 in | Wt 241.0 lb

## 2016-01-24 DIAGNOSIS — K5180 Other ulcerative colitis without complications: Secondary | ICD-10-CM

## 2016-01-24 MED ORDER — NA SULFATE-K SULFATE-MG SULF 17.5-3.13-1.6 GM/177ML PO SOLN: 1.0000 | Freq: Once | ORAL | 0 refills | Status: AC

## 2016-02-04 ENCOUNTER — Encounter: Payer: Self-pay | Admitting: Gastroenterology

## 2016-02-04 ENCOUNTER — Ambulatory Visit (AMBULATORY_SURGERY_CENTER): Payer: Medicare HMO | Admitting: Gastroenterology

## 2016-02-04 VITALS — BP 119/78 | HR 67 | Temp 97.1°F | Resp 16 | Ht 73.0 in | Wt 241.0 lb

## 2016-02-04 DIAGNOSIS — Z1211 Encounter for screening for malignant neoplasm of colon: Secondary | ICD-10-CM

## 2016-02-04 DIAGNOSIS — Z6832 Body mass index (BMI) 32.0-32.9, adult: Secondary | ICD-10-CM | POA: Diagnosis not present

## 2016-02-04 DIAGNOSIS — K5100 Ulcerative (chronic) pancolitis without complications: Secondary | ICD-10-CM

## 2016-02-04 DIAGNOSIS — Z0100 Encounter for examination of eyes and vision without abnormal findings: Secondary | ICD-10-CM | POA: Diagnosis not present

## 2016-02-04 DIAGNOSIS — H524 Presbyopia: Secondary | ICD-10-CM | POA: Diagnosis not present

## 2016-02-04 DIAGNOSIS — K5180 Other ulcerative colitis without complications: Secondary | ICD-10-CM

## 2016-02-04 DIAGNOSIS — D125 Benign neoplasm of sigmoid colon: Secondary | ICD-10-CM

## 2016-02-04 DIAGNOSIS — R69 Illness, unspecified: Secondary | ICD-10-CM | POA: Diagnosis not present

## 2016-02-04 DIAGNOSIS — K635 Polyp of colon: Secondary | ICD-10-CM | POA: Diagnosis not present

## 2016-02-04 DIAGNOSIS — K5190 Ulcerative colitis, unspecified, without complications: Secondary | ICD-10-CM | POA: Diagnosis not present

## 2016-02-04 MED ORDER — SODIUM CHLORIDE 0.9 % IV SOLN: 500.0000 mL | INTRAVENOUS | Status: DC

## 2016-02-04 NOTE — Progress Notes (Signed)
  [facility] Post-op Note  Patient: Scott Armstrong  Procedure(s) Performed: colonoscopy  Patient Location: [facility] - Recovery Area  Anesthesia Type: Deep Sedation/Propofol  Level of Consciousness: oriented, sedated and patient cooperative  Airway and Oxygen Therapy: Patient Spontanous Breathing  Post-op Pain: none  Post-op Assessment:  Post-op Vital signs reviewed, Patient's Cardiovascular Status Stable, Respiratory Function Stable, Patent Airway, No signs of Nausea or vomiting and Pain level controlled  Post-op Vital Signs: Reviewed and stable  Complications: No apparent anesthesia complications  Baker Kogler E 9:37 AM

## 2016-02-04 NOTE — Patient Instructions (Signed)
YOU HAD AN ENDOSCOPIC PROCEDURE TODAY AT [facility]:   Refer to the procedure report that was given to you for any specific questions about what was found during the examination.  If the procedure report does not answer your questions, please call your gastroenterologist to clarify.  If you requested that your care partner not be given the details of your procedure findings, then the procedure report has been included in a sealed envelope for you to review at your convenience later.  YOU SHOULD EXPECT: Some feelings of bloating in the abdomen. Passage of more gas than usual.  Walking can help get rid of the air that was put into your GI tract during the procedure and reduce the bloating. If you had a lower endoscopy (such as a colonoscopy or flexible sigmoidoscopy) you may notice spotting of blood in your stool or on the toilet paper. If you underwent a bowel prep for your procedure, you may not have a normal bowel movement for a few days.  Please Note:  You might notice some irritation and congestion in your nose or some drainage.  This is from the oxygen used during your procedure.  There is no need for concern and it should clear up in a day or so.  SYMPTOMS TO REPORT IMMEDIATELY:   Following lower endoscopy (colonoscopy or flexible sigmoidoscopy):  Excessive amounts of blood in the stool  Significant tenderness or worsening of abdominal pains  Swelling of the abdomen that is new, acute  Fever of 100F or higher   For urgent or emergent issues, a gastroenterologist can be reached at any hour by calling [phone].   DIET: Your first meal following the procedure should be a small meal and then it is ok to progress to your normal diet. Heavy or fried foods are harder to digest and may make you feel nauseous or bloated.  Likewise, meals heavy in dairy and vegetables can increase bloating.  Drink plenty of fluids but you should avoid alcoholic beverages for 24  hours.  ACTIVITY:  You should plan to take it easy for the rest of today and you should NOT DRIVE or use heavy machinery until tomorrow (because of the sedation medicines used during the test).    FOLLOW UP: Our staff will call the number listed on your records the next business day following your procedure to check on you and address any questions or concerns that you may have regarding the information given to you following your procedure. If we do not reach you, we will leave a message.  However, if you are feeling well and you are not experiencing any problems, there is no need to return our call.  We will assume that you have returned to your regular daily activities without incident.  If any biopsies were taken you will be contacted by phone or by letter within the next 1-3 weeks.  Please call us at [phone] if you have not heard about the biopsies in 3 weeks.    SIGNATURES/CONFIDENTIALITY: You and/or your care partner have signed paperwork which will be entered into your electronic medical record.  These signatures attest to the fact that that the information above on your After Visit Summary has been reviewed and is understood.  Full responsibility of the confidentiality of this discharge information lies with you and/or your care-partner.  HANDOUTS FOR POLYPS, HIGH FIBER.

## 2016-02-04 NOTE — Progress Notes (Signed)
Called to room to assist during endoscopic procedure.  Patient ID and intended procedure confirmed with present staff. Received instructions for my participation in the procedure from the performing physician.  

## 2016-02-04 NOTE — Op Note (Signed)
[place] Patient Name: Scott Armstrong Procedure Date: 02/04/2016 9:10 AM MRN: [id number] Endoscopist: Ladene Artist , MD Age: 65 Referring MD:  Date of Birth: 07-22-1950 Gender: Male Account #: [id number] Procedure:                Colonoscopy Indications:              High risk colon cancer surveillance: Ulcerative                            left sided colitis Medicines:                Monitored Anesthesia Care Procedure:                Pre-Anesthesia Assessment:                           - Prior to the procedure, a History and Physical                            was performed, and patient medications and                            allergies were reviewed. The patient's tolerance of                            previous anesthesia was also reviewed. The risks                            and benefits of the procedure and the sedation                            options and risks were discussed with the patient.                            All questions were answered, and informed consent                            was obtained. Prior Anticoagulants: The patient has                            taken no previous anticoagulant or antiplatelet                            agents. ASA Grade Assessment: II - A patient with                            mild systemic disease. After reviewing the risks                            and benefits, the patient was deemed in                            satisfactory condition to undergo the procedure.  After obtaining informed consent, the colonoscope                            was passed under direct vision. Throughout the                            procedure, the patient's blood pressure, pulse, and                            oxygen saturations were monitored continuously. The                            Model PCF-H190DL [id number]) scope was introduced                            through the anus and advanced to the the  cecum,                            identified by appendiceal orifice and ileocecal                            valve. The ileocecal valve, appendiceal orifice,                            and rectum were photographed. The quality of the                            bowel preparation was good. The colonoscopy was                            performed without difficulty. The patient tolerated                            the procedure well. Scope In: 9:14:46 AM Scope Out: 9:31:30 AM Scope Withdrawal Time: 0 hours 13 minutes 53 seconds  Total Procedure Duration: 0 hours 16 minutes 44 seconds  Findings:                 A 6 mm polyp was found in the sigmoid colon. The                            polyp was sessile. The polyp was removed with a                            cold snare. Resection and retrieval were complete.                           The exam was otherwise without abnormality on                            direct and retroflexion views. Random biopsies were                            taked with a cold forceps  for histology.                           Many medium-mouthed diverticula were found in the                            sigmoid colon and descending colon. There was                            evidence of diverticular spasm. [place] biopsies                            were taken with a cold forceps for histology.                           A few medium-mouthed diverticula were found in the                            transverse colon. There was no evidence of                            diverticular bleeding. Random biopsies were taken                            with a cold forceps for histology. Complications:            No immediate complications. Estimated blood loss:                            None. Estimated Blood Loss:     Estimated blood loss: none. Impression:               - One 6 mm polyp in the sigmoid colon, removed with                            a cold snare. Resected and  retrieved.                           - Moderate diverticulosis in the sigmoid colon and                            in the descending colon. There was evidence of                            diverticular spasm. Biopsied.                           - Mild diverticulosis in the transverse colon.                            There was no evidence of diverticular bleeding.                            Biopsied. Recommendation:           - Repeat colonoscopy in 3 years for  surveillance.                           - Patient has a contact number available for                            emergencies. The signs and symptoms of potential                            delayed complications were discussed with the                            patient. Return to normal activities tomorrow.                            Written discharge instructions were provided to the                            patient.                           - High fiber diet.                           - Continue present medications.                           - Await pathology results. Ladene Artist, MD 02/04/2016 9:36:28 AM This report has been signed electronically.

## 2016-02-07 ENCOUNTER — Telehealth: Payer: Self-pay | Admitting: *Deleted

## 2016-02-07 NOTE — Telephone Encounter (Signed)
Message left

## 2016-02-10 DIAGNOSIS — J329 Chronic sinusitis, unspecified: Secondary | ICD-10-CM | POA: Diagnosis not present

## 2016-02-10 DIAGNOSIS — H9202 Otalgia, left ear: Secondary | ICD-10-CM | POA: Diagnosis not present

## 2016-02-21 ENCOUNTER — Encounter: Payer: Self-pay | Admitting: Gastroenterology

## 2016-02-29 DIAGNOSIS — R030 Elevated blood-pressure reading, without diagnosis of hypertension: Secondary | ICD-10-CM | POA: Diagnosis not present

## 2016-03-27 IMAGING — US US ABDOMEN COMPLETE
1 series · 14 of 25 positions shown · non-contrast
Comparison: None.

CLINICAL DATA: Abnormal liver function tests.

EXAM:
ULTRASOUND ABDOMEN COMPLETE

[Series 1: us abdomen complete · 0.24mm/px · 14 of 77 slices shown]
[im 1/77]
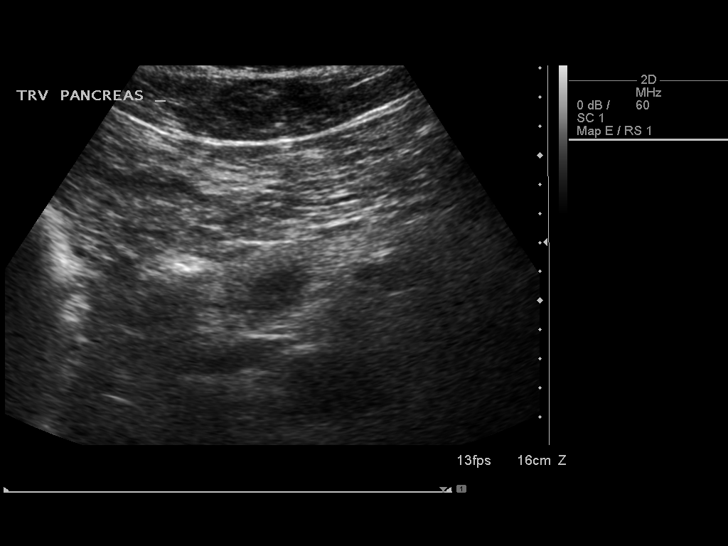
[im 7/77]
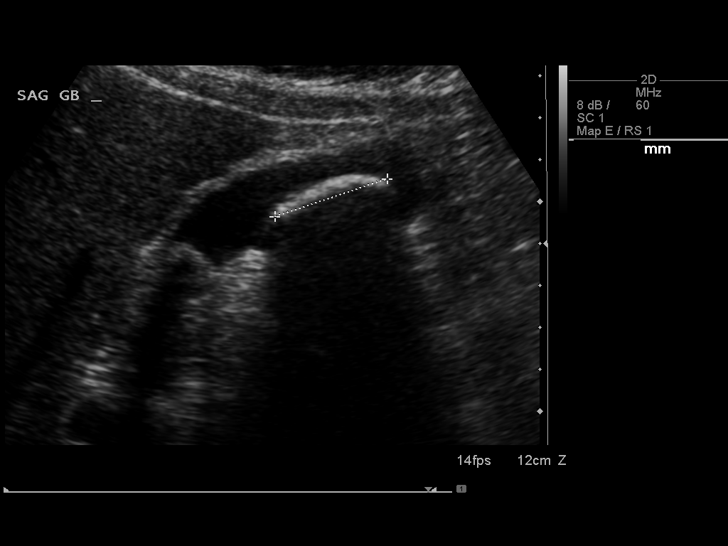
[im 13/77]
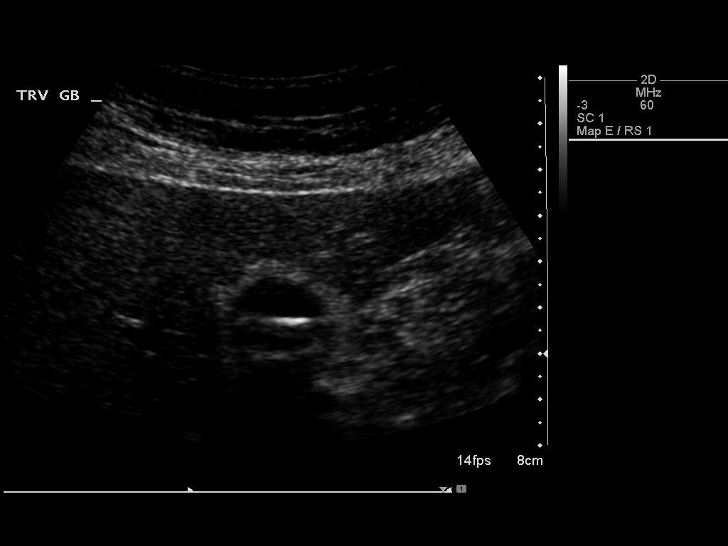
[im 20/77]
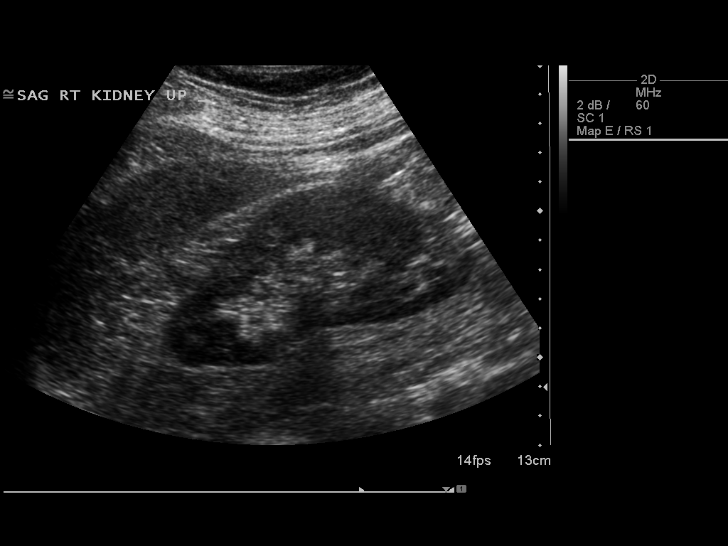
[im 26/77]
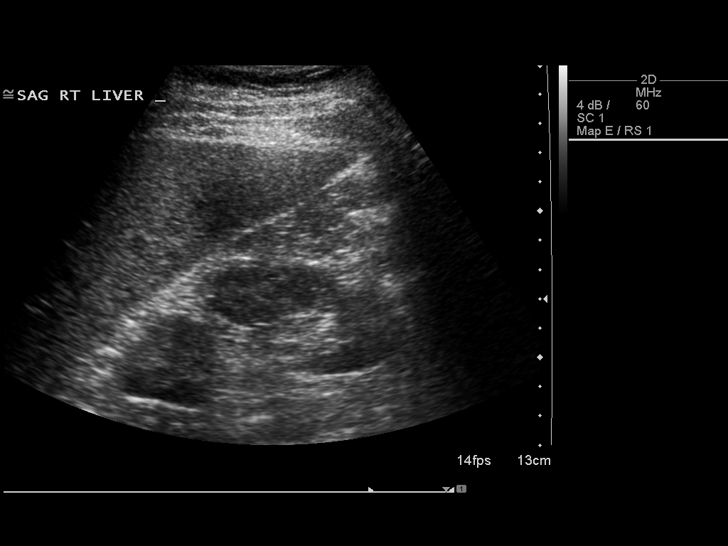
[im 29/77]
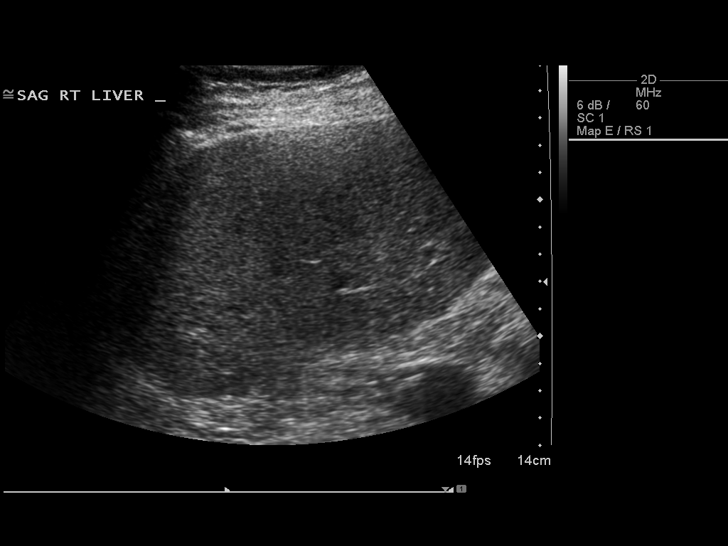
[im 35/77]
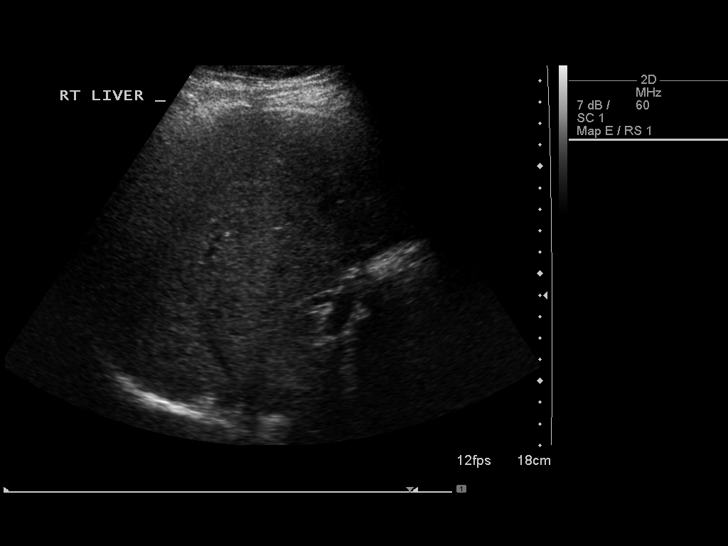
[im 42/77]
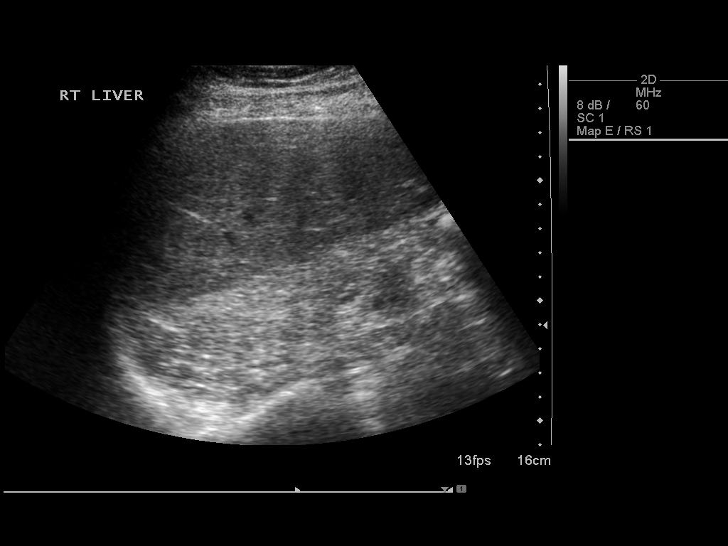
[im 48/77]
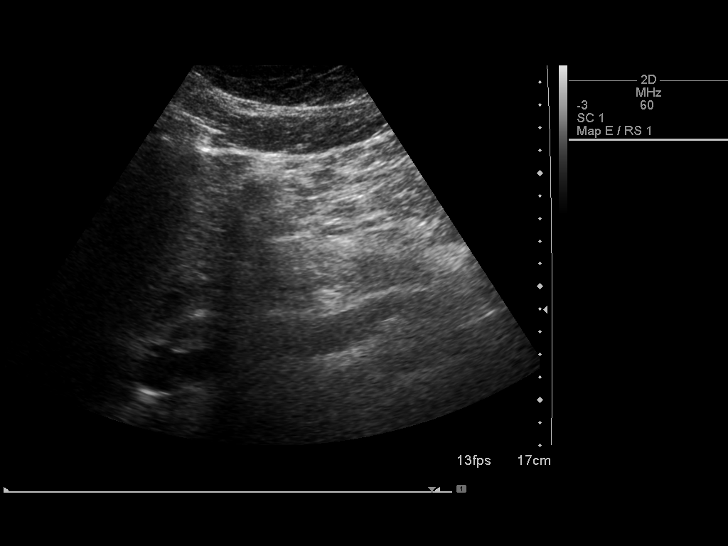
[im 51/77]
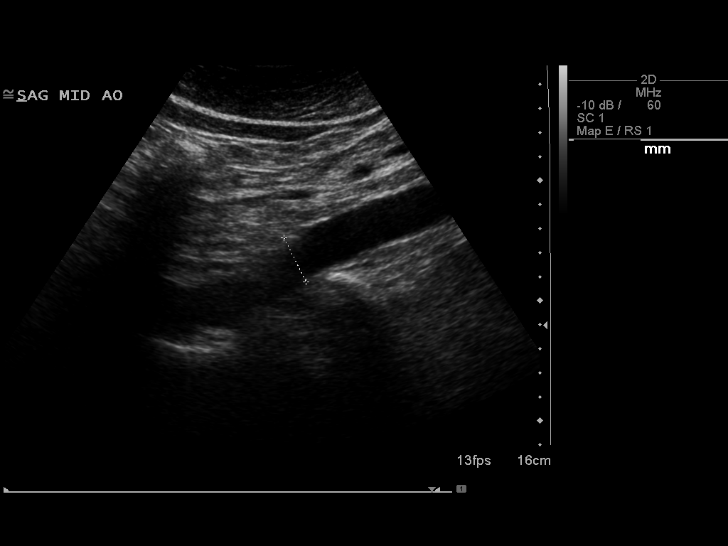
[im 58/77]
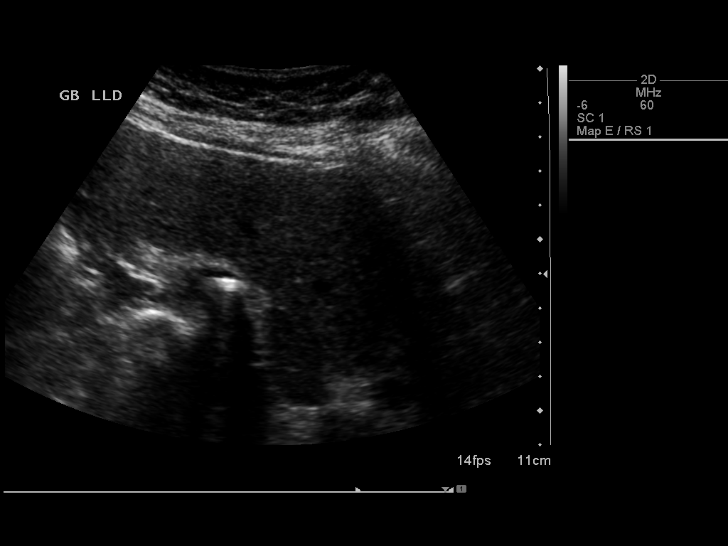
[im 64/77]
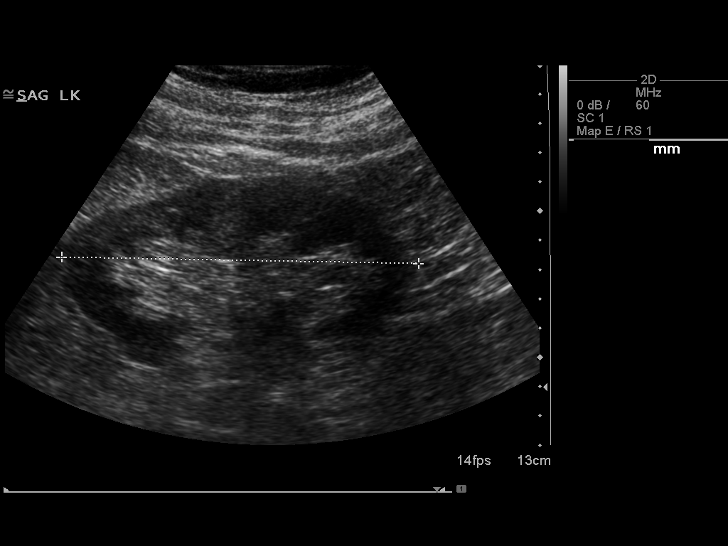
[im 70/77]
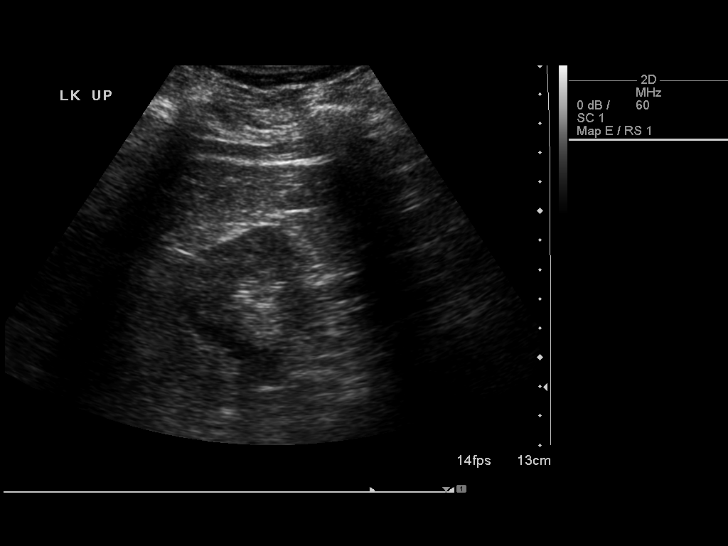
[im 77/77]
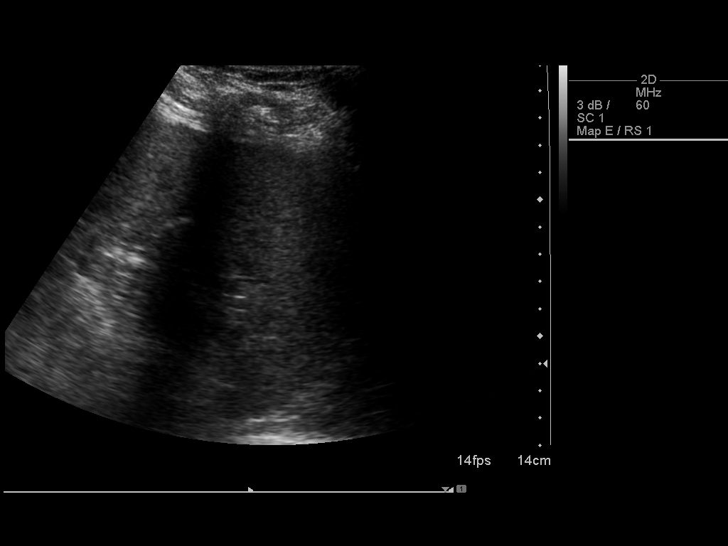

[14 of 25 positions shown; findings below may reference images not displayed]

FINDINGS: Gallbladder: 2 mobile gallstones measuring 2.8 and 1.7 cm
respectively are noted. Gallbladder wall thickness is 3 mm. Negative
Murphy sign. No pericholecystic fluid collection .

Common bile duct: Diameter: 4.2 mm

Liver: No focal lesion identified. Inhomogeneous hepatic echotexture
suggesting fatty infiltration and/or hepatocellular disease.

IVC: No abnormality visualized.

Pancreas: Visualized portion unremarkable.

Spleen: Size and appearance within normal limits.

Right Kidney: Length: 11.1 cm. Echogenicity within normal limits. No
mass or hydronephrosis visualized.

Left Kidney: Length: 12.2 cm. Echogenicity within normal limits. No
mass or hydronephrosis visualized.

Abdominal aorta: No aneurysm visualized.

Other findings: None.
IMPRESSION: 1. Gallstones. Mild prominence of the gallbladder wall.
Cholecystitis cannot be excluded.

2. Inhomogeneous hepatic echotexture consistent fatty infiltration
and/or hepatocellular disease.

## 2016-04-17 DIAGNOSIS — Z0000 Encounter for general adult medical examination without abnormal findings: Secondary | ICD-10-CM | POA: Diagnosis not present

## 2016-04-17 DIAGNOSIS — R739 Hyperglycemia, unspecified: Secondary | ICD-10-CM | POA: Diagnosis not present

## 2016-04-17 DIAGNOSIS — E7800 Pure hypercholesterolemia, unspecified: Secondary | ICD-10-CM | POA: Diagnosis not present

## 2016-04-20 DIAGNOSIS — Z23 Encounter for immunization: Secondary | ICD-10-CM | POA: Diagnosis not present

## 2016-04-20 DIAGNOSIS — Z0000 Encounter for general adult medical examination without abnormal findings: Secondary | ICD-10-CM | POA: Diagnosis not present

## 2016-04-20 DIAGNOSIS — Z125 Encounter for screening for malignant neoplasm of prostate: Secondary | ICD-10-CM | POA: Diagnosis not present

## 2016-04-20 DIAGNOSIS — R739 Hyperglycemia, unspecified: Secondary | ICD-10-CM | POA: Diagnosis not present

## 2016-07-17 DIAGNOSIS — R69 Illness, unspecified: Secondary | ICD-10-CM | POA: Diagnosis not present

## 2016-08-16 DIAGNOSIS — R69 Illness, unspecified: Secondary | ICD-10-CM | POA: Diagnosis not present

## 2016-10-23 DIAGNOSIS — Z0000 Encounter for general adult medical examination without abnormal findings: Secondary | ICD-10-CM | POA: Diagnosis not present

## 2016-10-23 DIAGNOSIS — R739 Hyperglycemia, unspecified: Secondary | ICD-10-CM | POA: Diagnosis not present

## 2016-10-23 DIAGNOSIS — E7800 Pure hypercholesterolemia, unspecified: Secondary | ICD-10-CM | POA: Diagnosis not present

## 2016-10-23 DIAGNOSIS — Z125 Encounter for screening for malignant neoplasm of prostate: Secondary | ICD-10-CM | POA: Diagnosis not present

## 2016-10-26 DIAGNOSIS — R739 Hyperglycemia, unspecified: Secondary | ICD-10-CM | POA: Diagnosis not present

## 2016-10-26 DIAGNOSIS — Z0000 Encounter for general adult medical examination without abnormal findings: Secondary | ICD-10-CM | POA: Diagnosis not present

## 2016-10-26 DIAGNOSIS — Z23 Encounter for immunization: Secondary | ICD-10-CM | POA: Diagnosis not present

## 2016-10-26 DIAGNOSIS — E7800 Pure hypercholesterolemia, unspecified: Secondary | ICD-10-CM | POA: Diagnosis not present

## 2016-11-02 DIAGNOSIS — M47816 Spondylosis without myelopathy or radiculopathy, lumbar region: Secondary | ICD-10-CM | POA: Diagnosis not present

## 2016-11-02 DIAGNOSIS — M13161 Monoarthritis, not elsewhere classified, right knee: Secondary | ICD-10-CM | POA: Diagnosis not present

## 2016-11-02 DIAGNOSIS — R69 Illness, unspecified: Secondary | ICD-10-CM | POA: Diagnosis not present

## 2016-11-02 DIAGNOSIS — M13162 Monoarthritis, not elsewhere classified, left knee: Secondary | ICD-10-CM | POA: Diagnosis not present

## 2016-11-02 DIAGNOSIS — Z6832 Body mass index (BMI) 32.0-32.9, adult: Secondary | ICD-10-CM | POA: Diagnosis not present

## 2016-11-02 DIAGNOSIS — Z0000 Encounter for general adult medical examination without abnormal findings: Secondary | ICD-10-CM | POA: Diagnosis not present

## 2016-11-02 DIAGNOSIS — E669 Obesity, unspecified: Secondary | ICD-10-CM | POA: Diagnosis not present

## 2016-11-02 DIAGNOSIS — R0989 Other specified symptoms and signs involving the circulatory and respiratory systems: Secondary | ICD-10-CM | POA: Diagnosis not present

## 2016-11-02 DIAGNOSIS — J301 Allergic rhinitis due to pollen: Secondary | ICD-10-CM | POA: Diagnosis not present

## 2016-11-02 DIAGNOSIS — K5190 Ulcerative colitis, unspecified, without complications: Secondary | ICD-10-CM | POA: Diagnosis not present

## 2016-11-17 DIAGNOSIS — Z0189 Encounter for other specified special examinations: Secondary | ICD-10-CM | POA: Diagnosis not present

## 2016-11-17 DIAGNOSIS — E782 Mixed hyperlipidemia: Secondary | ICD-10-CM | POA: Diagnosis not present

## 2016-11-17 DIAGNOSIS — R002 Palpitations: Secondary | ICD-10-CM | POA: Diagnosis not present

## 2016-11-17 DIAGNOSIS — I10 Essential (primary) hypertension: Secondary | ICD-10-CM | POA: Diagnosis not present

## 2016-12-01 DIAGNOSIS — I10 Essential (primary) hypertension: Secondary | ICD-10-CM | POA: Diagnosis not present

## 2016-12-01 DIAGNOSIS — R002 Palpitations: Secondary | ICD-10-CM | POA: Diagnosis not present

## 2017-01-23 DIAGNOSIS — E7800 Pure hypercholesterolemia, unspecified: Secondary | ICD-10-CM | POA: Diagnosis not present

## 2017-01-23 DIAGNOSIS — R739 Hyperglycemia, unspecified: Secondary | ICD-10-CM | POA: Diagnosis not present

## 2017-01-25 DIAGNOSIS — R69 Illness, unspecified: Secondary | ICD-10-CM | POA: Diagnosis not present

## 2017-01-29 DIAGNOSIS — E7800 Pure hypercholesterolemia, unspecified: Secondary | ICD-10-CM | POA: Diagnosis not present

## 2017-01-29 DIAGNOSIS — R739 Hyperglycemia, unspecified: Secondary | ICD-10-CM | POA: Diagnosis not present

## 2017-02-06 DIAGNOSIS — Z0100 Encounter for examination of eyes and vision without abnormal findings: Secondary | ICD-10-CM | POA: Diagnosis not present

## 2017-02-06 DIAGNOSIS — H524 Presbyopia: Secondary | ICD-10-CM | POA: Diagnosis not present

## 2017-02-08 DIAGNOSIS — R69 Illness, unspecified: Secondary | ICD-10-CM | POA: Diagnosis not present

## 2017-04-11 DIAGNOSIS — H9202 Otalgia, left ear: Secondary | ICD-10-CM | POA: Diagnosis not present

## 2017-06-05 DIAGNOSIS — E7800 Pure hypercholesterolemia, unspecified: Secondary | ICD-10-CM | POA: Diagnosis not present

## 2017-06-05 DIAGNOSIS — R739 Hyperglycemia, unspecified: Secondary | ICD-10-CM | POA: Diagnosis not present

## 2017-06-08 DIAGNOSIS — R739 Hyperglycemia, unspecified: Secondary | ICD-10-CM | POA: Diagnosis not present

## 2017-06-08 DIAGNOSIS — Z23 Encounter for immunization: Secondary | ICD-10-CM | POA: Diagnosis not present

## 2017-06-08 DIAGNOSIS — E7800 Pure hypercholesterolemia, unspecified: Secondary | ICD-10-CM | POA: Diagnosis not present

## 2017-06-08 DIAGNOSIS — Z0000 Encounter for general adult medical examination without abnormal findings: Secondary | ICD-10-CM | POA: Diagnosis not present

## 2017-08-02 DIAGNOSIS — R69 Illness, unspecified: Secondary | ICD-10-CM | POA: Diagnosis not present

## 2017-08-20 DIAGNOSIS — Z809 Family history of malignant neoplasm, unspecified: Secondary | ICD-10-CM | POA: Diagnosis not present

## 2017-08-20 DIAGNOSIS — Z823 Family history of stroke: Secondary | ICD-10-CM | POA: Diagnosis not present

## 2017-08-20 DIAGNOSIS — Z87891 Personal history of nicotine dependence: Secondary | ICD-10-CM | POA: Diagnosis not present

## 2017-08-20 DIAGNOSIS — R69 Illness, unspecified: Secondary | ICD-10-CM | POA: Diagnosis not present

## 2017-08-20 DIAGNOSIS — K5190 Ulcerative colitis, unspecified, without complications: Secondary | ICD-10-CM | POA: Diagnosis not present

## 2017-08-20 DIAGNOSIS — J309 Allergic rhinitis, unspecified: Secondary | ICD-10-CM | POA: Diagnosis not present

## 2017-08-20 DIAGNOSIS — Z8249 Family history of ischemic heart disease and other diseases of the circulatory system: Secondary | ICD-10-CM | POA: Diagnosis not present

## 2017-08-20 DIAGNOSIS — E669 Obesity, unspecified: Secondary | ICD-10-CM | POA: Diagnosis not present

## 2017-08-20 DIAGNOSIS — Z6831 Body mass index (BMI) 31.0-31.9, adult: Secondary | ICD-10-CM | POA: Diagnosis not present

## 2017-12-07 DIAGNOSIS — R739 Hyperglycemia, unspecified: Secondary | ICD-10-CM | POA: Diagnosis not present

## 2017-12-07 DIAGNOSIS — E7800 Pure hypercholesterolemia, unspecified: Secondary | ICD-10-CM | POA: Diagnosis not present

## 2017-12-07 DIAGNOSIS — Z0000 Encounter for general adult medical examination without abnormal findings: Secondary | ICD-10-CM | POA: Diagnosis not present

## 2017-12-07 DIAGNOSIS — Z125 Encounter for screening for malignant neoplasm of prostate: Secondary | ICD-10-CM | POA: Diagnosis not present

## 2017-12-14 DIAGNOSIS — E039 Hypothyroidism, unspecified: Secondary | ICD-10-CM | POA: Diagnosis not present

## 2017-12-14 DIAGNOSIS — Z0000 Encounter for general adult medical examination without abnormal findings: Secondary | ICD-10-CM | POA: Diagnosis not present

## 2017-12-14 DIAGNOSIS — R739 Hyperglycemia, unspecified: Secondary | ICD-10-CM | POA: Diagnosis not present

## 2018-01-16 DIAGNOSIS — H524 Presbyopia: Secondary | ICD-10-CM | POA: Diagnosis not present

## 2018-05-10 DIAGNOSIS — R69 Illness, unspecified: Secondary | ICD-10-CM | POA: Diagnosis not present

## 2018-06-10 DIAGNOSIS — E039 Hypothyroidism, unspecified: Secondary | ICD-10-CM | POA: Diagnosis not present

## 2018-06-10 DIAGNOSIS — R739 Hyperglycemia, unspecified: Secondary | ICD-10-CM | POA: Diagnosis not present

## 2018-06-10 DIAGNOSIS — E7800 Pure hypercholesterolemia, unspecified: Secondary | ICD-10-CM | POA: Diagnosis not present

## 2018-06-10 DIAGNOSIS — Z0000 Encounter for general adult medical examination without abnormal findings: Secondary | ICD-10-CM | POA: Diagnosis not present

## 2018-06-14 DIAGNOSIS — R69 Illness, unspecified: Secondary | ICD-10-CM | POA: Diagnosis not present

## 2018-06-14 DIAGNOSIS — R739 Hyperglycemia, unspecified: Secondary | ICD-10-CM | POA: Diagnosis not present

## 2018-06-14 DIAGNOSIS — E7800 Pure hypercholesterolemia, unspecified: Secondary | ICD-10-CM | POA: Diagnosis not present

## 2018-07-25 DIAGNOSIS — S61412A Laceration without foreign body of left hand, initial encounter: Secondary | ICD-10-CM | POA: Diagnosis not present

## 2018-08-29 DIAGNOSIS — R69 Illness, unspecified: Secondary | ICD-10-CM | POA: Diagnosis not present

## 2018-12-11 DIAGNOSIS — Z809 Family history of malignant neoplasm, unspecified: Secondary | ICD-10-CM | POA: Diagnosis not present

## 2018-12-11 DIAGNOSIS — Z8249 Family history of ischemic heart disease and other diseases of the circulatory system: Secondary | ICD-10-CM | POA: Diagnosis not present

## 2018-12-11 DIAGNOSIS — M79673 Pain in unspecified foot: Secondary | ICD-10-CM | POA: Diagnosis not present

## 2018-12-11 DIAGNOSIS — K5190 Ulcerative colitis, unspecified, without complications: Secondary | ICD-10-CM | POA: Diagnosis not present

## 2018-12-11 DIAGNOSIS — K219 Gastro-esophageal reflux disease without esophagitis: Secondary | ICD-10-CM | POA: Diagnosis not present

## 2018-12-11 DIAGNOSIS — J309 Allergic rhinitis, unspecified: Secondary | ICD-10-CM | POA: Diagnosis not present

## 2018-12-11 DIAGNOSIS — Z7982 Long term (current) use of aspirin: Secondary | ICD-10-CM | POA: Diagnosis not present

## 2018-12-11 DIAGNOSIS — G8929 Other chronic pain: Secondary | ICD-10-CM | POA: Diagnosis not present

## 2018-12-11 DIAGNOSIS — R69 Illness, unspecified: Secondary | ICD-10-CM | POA: Diagnosis not present

## 2018-12-12 DIAGNOSIS — K5190 Ulcerative colitis, unspecified, without complications: Secondary | ICD-10-CM | POA: Diagnosis not present

## 2018-12-12 DIAGNOSIS — Z125 Encounter for screening for malignant neoplasm of prostate: Secondary | ICD-10-CM | POA: Diagnosis not present

## 2018-12-12 DIAGNOSIS — K7689 Other specified diseases of liver: Secondary | ICD-10-CM | POA: Diagnosis not present

## 2018-12-12 DIAGNOSIS — E7800 Pure hypercholesterolemia, unspecified: Secondary | ICD-10-CM | POA: Diagnosis not present

## 2018-12-12 DIAGNOSIS — R7309 Other abnormal glucose: Secondary | ICD-10-CM | POA: Diagnosis not present

## 2018-12-12 DIAGNOSIS — R739 Hyperglycemia, unspecified: Secondary | ICD-10-CM | POA: Diagnosis not present

## 2018-12-12 DIAGNOSIS — Z0000 Encounter for general adult medical examination without abnormal findings: Secondary | ICD-10-CM | POA: Diagnosis not present

## 2018-12-19 DIAGNOSIS — Z125 Encounter for screening for malignant neoplasm of prostate: Secondary | ICD-10-CM | POA: Diagnosis not present

## 2018-12-19 DIAGNOSIS — R739 Hyperglycemia, unspecified: Secondary | ICD-10-CM | POA: Diagnosis not present

## 2018-12-19 DIAGNOSIS — E7800 Pure hypercholesterolemia, unspecified: Secondary | ICD-10-CM | POA: Diagnosis not present

## 2018-12-19 DIAGNOSIS — K5190 Ulcerative colitis, unspecified, without complications: Secondary | ICD-10-CM | POA: Diagnosis not present

## 2018-12-19 DIAGNOSIS — R69 Illness, unspecified: Secondary | ICD-10-CM | POA: Diagnosis not present

## 2018-12-19 DIAGNOSIS — Z0000 Encounter for general adult medical examination without abnormal findings: Secondary | ICD-10-CM | POA: Diagnosis not present

## 2018-12-19 DIAGNOSIS — R030 Elevated blood-pressure reading, without diagnosis of hypertension: Secondary | ICD-10-CM | POA: Diagnosis not present

## 2019-01-24 ENCOUNTER — Encounter: Payer: Self-pay | Admitting: Gastroenterology

## 2019-01-30 ENCOUNTER — Encounter: Payer: Self-pay | Admitting: Gastroenterology

## 2019-02-07 DIAGNOSIS — J302 Other seasonal allergic rhinitis: Secondary | ICD-10-CM | POA: Insufficient documentation

## 2019-02-07 DIAGNOSIS — F845 Asperger's syndrome: Secondary | ICD-10-CM | POA: Insufficient documentation

## 2019-02-07 DIAGNOSIS — K5190 Ulcerative colitis, unspecified, without complications: Secondary | ICD-10-CM | POA: Insufficient documentation

## 2019-02-07 DIAGNOSIS — G4700 Insomnia, unspecified: Secondary | ICD-10-CM | POA: Insufficient documentation

## 2019-02-07 DIAGNOSIS — M1990 Unspecified osteoarthritis, unspecified site: Secondary | ICD-10-CM | POA: Insufficient documentation

## 2019-02-07 DIAGNOSIS — F419 Anxiety disorder, unspecified: Secondary | ICD-10-CM | POA: Insufficient documentation

## 2019-02-19 ENCOUNTER — Other Ambulatory Visit: Payer: Self-pay

## 2019-02-19 ENCOUNTER — Ambulatory Visit (AMBULATORY_SURGERY_CENTER): Payer: Self-pay | Admitting: *Deleted

## 2019-02-19 VITALS — Temp 96.8°F | Ht 74.0 in | Wt 230.0 lb

## 2019-02-19 DIAGNOSIS — K5180 Other ulcerative colitis without complications: Secondary | ICD-10-CM

## 2019-02-19 MED ORDER — NA SULFATE-K SULFATE-MG SULF 17.5-3.13-1.6 GM/177ML PO SOLN: ORAL | 0 refills | Status: DC

## 2019-02-19 NOTE — Progress Notes (Signed)
Patient is here in person for PV today. Patient denies any allergies to eggs or soy. Patient denies any problems with anesthesia/sedation. Patient denies any oxygen use at home. Patient denies taking any diet/weight loss medications or blood thinners. EMMI education assisgned to patient on colonoscopy, this was explained and instructions given to patient.Pt is aware that care partner will wait in the car during procedure; if they feel like they will be too hot to wait in the car; they may wait in the lobby.  We want them to wear a mask (we do not have any that we can provide them), practice social distancing, and we will check their temperatures when they get here.  I did remind patient that their care partner needs to stay in the parking lot the entire time. Pt will wear mask into building. 

## 2019-02-26 ENCOUNTER — Encounter: Payer: Self-pay | Admitting: Gastroenterology

## 2019-03-04 ENCOUNTER — Telehealth: Payer: Self-pay

## 2019-03-04 NOTE — Telephone Encounter (Signed)
Covid-19 screening questions   Do you now or have you had a fever in the last 14 days?  Do you have any respiratory symptoms of shortness of breath or cough now or in the last 14 days?  Do you have any family members or close contacts with diagnosed or suspected Covid-19 in the past 14 days?  Have you been tested for Covid-19 and found to be positive?       

## 2019-03-04 NOTE — Telephone Encounter (Signed)
Pt returned call and answered NO to the [place] screening questions

## 2019-03-05 ENCOUNTER — Other Ambulatory Visit: Payer: Self-pay

## 2019-03-05 ENCOUNTER — Encounter: Payer: Self-pay | Admitting: Gastroenterology

## 2019-03-05 ENCOUNTER — Ambulatory Visit (AMBULATORY_SURGERY_CENTER): Payer: Medicare HMO | Admitting: Gastroenterology

## 2019-03-05 VITALS — BP 132/86 | HR 61 | Temp 98.3°F | Resp 8 | Ht 74.0 in | Wt 230.0 lb

## 2019-03-05 DIAGNOSIS — Z86010 Personal history of colonic polyps: Secondary | ICD-10-CM

## 2019-03-05 DIAGNOSIS — K5190 Ulcerative colitis, unspecified, without complications: Secondary | ICD-10-CM | POA: Diagnosis not present

## 2019-03-05 DIAGNOSIS — E669 Obesity, unspecified: Secondary | ICD-10-CM | POA: Diagnosis not present

## 2019-03-05 DIAGNOSIS — D128 Benign neoplasm of rectum: Secondary | ICD-10-CM | POA: Diagnosis not present

## 2019-03-05 DIAGNOSIS — K5180 Other ulcerative colitis without complications: Secondary | ICD-10-CM | POA: Diagnosis not present

## 2019-03-05 DIAGNOSIS — D125 Benign neoplasm of sigmoid colon: Secondary | ICD-10-CM

## 2019-03-05 DIAGNOSIS — K635 Polyp of colon: Secondary | ICD-10-CM | POA: Diagnosis not present

## 2019-03-05 DIAGNOSIS — K621 Rectal polyp: Secondary | ICD-10-CM | POA: Diagnosis not present

## 2019-03-05 MED ORDER — SODIUM CHLORIDE 0.9 % IV SOLN: 500.0000 mL | Freq: Once | INTRAVENOUS | Status: DC

## 2019-03-05 NOTE — Progress Notes (Signed)
Report to PACU, RN, vss, BBS= Clear.  

## 2019-03-05 NOTE — Progress Notes (Signed)
Called to room to assist during endoscopic procedure.  Patient ID and intended procedure confirmed with present staff. Received instructions for my participation in the procedure from the performing physician.  

## 2019-03-05 NOTE — Patient Instructions (Signed)
YOU HAD AN ENDOSCOPIC PROCEDURE TODAY AT THE [facility]:   Refer to the procedure report that was given to you for any specific questions about what was found during the examination.  If the procedure report does not answer your questions, please call your gastroenterologist to clarify.  If you requested that your care partner not be given the details of your procedure findings, then the procedure report has been included in a sealed envelope for you to review at your convenience later.  YOU SHOULD EXPECT: Some feelings of bloating in the abdomen. Passage of more gas than usual.  Walking can help get rid of the air that was put into your GI tract during the procedure and reduce the bloating. If you had a lower endoscopy (such as a colonoscopy or flexible sigmoidoscopy) you may notice spotting of blood in your stool or on the toilet paper. If you underwent a bowel prep for your procedure, you may not have a normal bowel movement for a few days.  Please Note:  You might notice some irritation and congestion in your nose or some drainage.  This is from the oxygen used during your procedure.  There is no need for concern and it should clear up in a day or so.  SYMPTOMS TO REPORT IMMEDIATELY:   Following lower endoscopy (colonoscopy or flexible sigmoidoscopy):  Excessive amounts of blood in the stool  Significant tenderness or worsening of abdominal pains  Swelling of the abdomen that is new, acute  Fever of 100F or higher  For urgent or emergent issues, a gastroenterologist can be reached at any hour by calling [phone].   DIET:  We do recommend a small meal at first, but then you may proceed to your regular diet.  Drink plenty of fluids but you should avoid alcoholic beverages for 24 hours.  ACTIVITY:  You should plan to take it easy for the rest of today and you should NOT DRIVE or use heavy machinery until tomorrow (because of the sedation medicines used during the test).     FOLLOW UP: Our staff will call the number listed on your records 48-72 hours following your procedure to check on you and address any questions or concerns that you may have regarding the information given to you following your procedure. If we do not reach you, we will leave a message.  We will attempt to reach you two times.  During this call, we will ask if you have developed any symptoms of COVID 19. If you develop any symptoms (ie: fever, flu-like symptoms, shortness of breath, cough etc.) before then, please call [phone].  If you test positive for Covid 19 in the 2 weeks post procedure, please call and report this information to us.    If any biopsies were taken you will be contacted by phone or by letter within the next 1-3 weeks.  Please call us at [phone] if you have not heard about the biopsies in 3 weeks.    SIGNATURES/CONFIDENTIALITY: You and/or your care partner have signed paperwork which will be entered into your electronic medical record.  These signatures attest to the fact that that the information above on your After Visit Summary has been reviewed and is understood.  Full responsibility of the confidentiality of this discharge information lies with you and/or your care-partner. 

## 2019-03-05 NOTE — Progress Notes (Signed)
Scott Armstrong   Pt's states no medical or surgical changes since previsit or office visit.

## 2019-03-05 NOTE — Op Note (Signed)
[facility] Patient Name: Scott Armstrong Procedure Date: 03/05/2019 8:31 AM MRN: [id number] Endoscopist: Ladene Artist , MD Age: 68 Referring MD:  Date of Birth: 1951-04-25 Gender: Male Account #: [id number] Procedure:                Colonoscopy Indications:              High risk colon cancer surveillance: Ulcerative                            left sided colitis of 8 (or more) years duration Medicines:                Monitored Anesthesia Care Procedure:                Pre-Anesthesia Assessment:                           - Prior to the procedure, a History and Physical                            was performed, and patient medications and                            allergies were reviewed. The patient's tolerance of                            previous anesthesia was also reviewed. The risks                            and benefits of the procedure and the sedation                            options and risks were discussed with the patient.                            All questions were answered, and informed consent                            was obtained. Prior Anticoagulants: The patient has                            taken no previous anticoagulant or antiplatelet                            agents. ASA Grade Assessment: II - A patient with                            mild systemic disease. After reviewing the risks                            and benefits, the patient was deemed in                            satisfactory condition to undergo the procedure.  After obtaining informed consent, the colonoscope                            was passed under direct vision. Throughout the                            procedure, the patient's blood pressure, pulse, and                            oxygen saturations were monitored continuously. The                            Colonoscope was introduced through the anus and                            advanced to  the the cecum, identified by                            appendiceal orifice and ileocecal valve. The                            ileocecal valve, appendiceal orifice, and rectum                            were photographed. The quality of the bowel                            preparation was good. The colonoscopy was performed                            without difficulty. The patient tolerated the                            procedure well. Scope In: 8:37:11 AM Scope Out: 8:57:03 AM Scope Withdrawal Time: 0 hours 16 minutes 27 seconds  Total Procedure Duration: 0 hours 19 minutes 52 seconds  Findings:                 The perianal and digital rectal examinations were                            normal.                           A 14 mm polyp was found in the sigmoid colon. The                            polyp was sessile. The polyp was removed with a hot                            snare. Resection and retrieval were complete.                           A 8 mm polyp was found in the rectum. The polyp was  sessile. The polyp was removed with a cold snare.                            Resection and retrieval were complete.                           Multiple medium-mouthed diverticula were found in                            the sigmoid colon, descending colon and transverse                            colon. There was no evidence of diverticular                            bleeding.                           The exam was otherwise without abnormality on                            direct and retroflexion views. Random biopsies                            obtained throughout. Complications:            No immediate complications. Estimated blood loss:                            None. Estimated Blood Loss:     Estimated blood loss: none. Impression:               - One 14 mm polyp in the sigmoid colon, removed                            with a hot snare. Resected and  retrieved.                           - One 8 mm polyp in the rectum, removed with a cold                            snare. Resected and retrieved.                           - Moderate diverticulosis in the sigmoid colon, in                            the descending colon and in the transverse colon.                           - The examination was otherwise normal on direct                            and retroflexion views. Recommendation:           - Repeat colonoscopy after studies are  complete for                            surveillance based on pathology results.                           - Patient has a contact number available for                            emergencies. The signs and symptoms of potential                            delayed complications were discussed with the                            patient. Return to normal activities tomorrow.                            Written discharge instructions were provided to the                            patient.                           - High fiber diet.                           - Continue present medications.                           - Await pathology results.                           - No aspirin, ibuprofen, naproxen, or other                            non-steroidal anti-inflammatory drugs for 2 weeks                            after polyp removal. Ladene Artist, MD 03/05/2019 9:04:53 AM This report has been signed electronically.

## 2019-03-07 ENCOUNTER — Telehealth: Payer: Self-pay

## 2019-03-07 NOTE — Telephone Encounter (Signed)
Attempted to reach pt. With follow-up call following endoscopic procedure 03/05/2019.  LM on pt. Voice mail to call if he has any questions or concerns.

## 2019-03-07 NOTE — Telephone Encounter (Signed)
Left message on follow up call. 

## 2019-03-08 DIAGNOSIS — R69 Illness, unspecified: Secondary | ICD-10-CM | POA: Diagnosis not present

## 2019-03-10 ENCOUNTER — Encounter: Payer: Self-pay | Admitting: Gastroenterology

## 2019-03-13 DIAGNOSIS — R69 Illness, unspecified: Secondary | ICD-10-CM | POA: Diagnosis not present

## 2019-04-22 DIAGNOSIS — R69 Illness, unspecified: Secondary | ICD-10-CM | POA: Diagnosis not present

## 2019-04-28 DIAGNOSIS — H524 Presbyopia: Secondary | ICD-10-CM | POA: Diagnosis not present

## 2019-05-19 DIAGNOSIS — K469 Unspecified abdominal hernia without obstruction or gangrene: Secondary | ICD-10-CM | POA: Diagnosis not present

## 2019-06-05 DIAGNOSIS — K439 Ventral hernia without obstruction or gangrene: Secondary | ICD-10-CM | POA: Diagnosis not present

## 2019-06-25 DIAGNOSIS — Z01818 Encounter for other preprocedural examination: Secondary | ICD-10-CM | POA: Diagnosis not present

## 2019-06-30 DIAGNOSIS — K436 Other and unspecified ventral hernia with obstruction, without gangrene: Secondary | ICD-10-CM | POA: Diagnosis not present

## 2019-07-21 DIAGNOSIS — E7800 Pure hypercholesterolemia, unspecified: Secondary | ICD-10-CM | POA: Diagnosis not present

## 2019-07-21 DIAGNOSIS — I10 Essential (primary) hypertension: Secondary | ICD-10-CM | POA: Diagnosis not present

## 2019-07-21 DIAGNOSIS — Z0000 Encounter for general adult medical examination without abnormal findings: Secondary | ICD-10-CM | POA: Diagnosis not present

## 2019-07-21 DIAGNOSIS — R739 Hyperglycemia, unspecified: Secondary | ICD-10-CM | POA: Diagnosis not present

## 2019-07-26 ENCOUNTER — Ambulatory Visit: Payer: Medicare HMO | Attending: Internal Medicine

## 2019-07-26 DIAGNOSIS — Z23 Encounter for immunization: Secondary | ICD-10-CM | POA: Insufficient documentation

## 2019-07-26 NOTE — Progress Notes (Signed)
   Covid-19 Vaccination Clinic  Name:  Scott Armstrong    MRN: [id number] DOB: [birth year]  07/26/2019  [name] was observed post Covid-19 immunization for 15 minutes without incidence. He was provided with Vaccine Information Sheet and instruction to access the V-Safe system.   [name] was instructed to call 911 with any severe reactions post vaccine: Marland Kitchen Difficulty breathing  . Swelling of your face and throat  . A fast heartbeat  . A bad rash all over your body  . Dizziness and weakness    Immunizations Administered    Name Date Dose VIS Date Route   Pfizer COVID-19 Vaccine 07/26/2019  1:24 PM 0.3 mL 06/13/2019 Intramuscular   Manufacturer: [place]   Lot: [id number]   [place]: [id number]

## 2019-07-28 DIAGNOSIS — R69 Illness, unspecified: Secondary | ICD-10-CM | POA: Diagnosis not present

## 2019-07-28 DIAGNOSIS — E7800 Pure hypercholesterolemia, unspecified: Secondary | ICD-10-CM | POA: Diagnosis not present

## 2019-07-28 DIAGNOSIS — Z0000 Encounter for general adult medical examination without abnormal findings: Secondary | ICD-10-CM | POA: Diagnosis not present

## 2019-08-16 ENCOUNTER — Ambulatory Visit: Payer: Medicare HMO | Attending: Internal Medicine

## 2019-08-16 DIAGNOSIS — Z23 Encounter for immunization: Secondary | ICD-10-CM

## 2019-08-16 NOTE — Progress Notes (Signed)
   Covid-19 Vaccination Clinic  Name:  Scott Armstrong    MRN: [id number] DOB: 05/07/1951  08/16/2019  [name] was observed post Covid-19 immunization for 15 minutes without incidence. He was provided with Vaccine Information Sheet and instruction to access the V-Safe system.   [name] was instructed to call 911 with any severe reactions post vaccine: Marland Kitchen Difficulty breathing  . Swelling of your face and throat  . A fast heartbeat  . A bad rash all over your body  . Dizziness and weakness    Immunizations Administered    Name Date Dose VIS Date Route   Pfizer COVID-19 Vaccine 08/16/2019 12:31 PM 0.3 mL 06/13/2019 Intramuscular   Manufacturer: [place]   Lot: [id number]   [place]: [id number]

## 2019-09-23 DIAGNOSIS — R69 Illness, unspecified: Secondary | ICD-10-CM | POA: Diagnosis not present

## 2020-01-06 DIAGNOSIS — R69 Illness, unspecified: Secondary | ICD-10-CM | POA: Diagnosis not present

## 2020-01-26 DIAGNOSIS — I10 Essential (primary) hypertension: Secondary | ICD-10-CM | POA: Diagnosis not present

## 2020-01-26 DIAGNOSIS — R69 Illness, unspecified: Secondary | ICD-10-CM | POA: Diagnosis not present

## 2020-01-26 DIAGNOSIS — E7800 Pure hypercholesterolemia, unspecified: Secondary | ICD-10-CM | POA: Diagnosis not present

## 2020-01-26 DIAGNOSIS — Z0000 Encounter for general adult medical examination without abnormal findings: Secondary | ICD-10-CM | POA: Diagnosis not present

## 2020-01-29 DIAGNOSIS — Z0000 Encounter for general adult medical examination without abnormal findings: Secondary | ICD-10-CM | POA: Diagnosis not present

## 2020-01-29 DIAGNOSIS — E7800 Pure hypercholesterolemia, unspecified: Secondary | ICD-10-CM | POA: Diagnosis not present

## 2020-01-29 DIAGNOSIS — I10 Essential (primary) hypertension: Secondary | ICD-10-CM | POA: Diagnosis not present

## 2020-01-29 DIAGNOSIS — K469 Unspecified abdominal hernia without obstruction or gangrene: Secondary | ICD-10-CM | POA: Diagnosis not present

## 2020-01-29 DIAGNOSIS — R69 Illness, unspecified: Secondary | ICD-10-CM | POA: Diagnosis not present

## 2020-01-29 DIAGNOSIS — K5190 Ulcerative colitis, unspecified, without complications: Secondary | ICD-10-CM | POA: Diagnosis not present

## 2020-01-29 DIAGNOSIS — R739 Hyperglycemia, unspecified: Secondary | ICD-10-CM | POA: Diagnosis not present

## 2020-02-25 DIAGNOSIS — K5190 Ulcerative colitis, unspecified, without complications: Secondary | ICD-10-CM | POA: Diagnosis not present

## 2020-02-25 DIAGNOSIS — R69 Illness, unspecified: Secondary | ICD-10-CM | POA: Diagnosis not present

## 2020-02-25 DIAGNOSIS — Z8249 Family history of ischemic heart disease and other diseases of the circulatory system: Secondary | ICD-10-CM | POA: Diagnosis not present

## 2020-02-25 DIAGNOSIS — F419 Anxiety disorder, unspecified: Secondary | ICD-10-CM | POA: Diagnosis not present

## 2020-02-25 DIAGNOSIS — F840 Autistic disorder: Secondary | ICD-10-CM | POA: Diagnosis not present

## 2020-02-25 DIAGNOSIS — J301 Allergic rhinitis due to pollen: Secondary | ICD-10-CM | POA: Diagnosis not present

## 2020-02-25 DIAGNOSIS — Z820 Family history of epilepsy and other diseases of the nervous system: Secondary | ICD-10-CM | POA: Diagnosis not present

## 2020-02-25 DIAGNOSIS — K219 Gastro-esophageal reflux disease without esophagitis: Secondary | ICD-10-CM | POA: Diagnosis not present

## 2020-02-25 DIAGNOSIS — R030 Elevated blood-pressure reading, without diagnosis of hypertension: Secondary | ICD-10-CM | POA: Diagnosis not present

## 2020-02-25 DIAGNOSIS — Z7982 Long term (current) use of aspirin: Secondary | ICD-10-CM | POA: Diagnosis not present

## 2020-04-06 DIAGNOSIS — R69 Illness, unspecified: Secondary | ICD-10-CM | POA: Diagnosis not present

## 2020-04-07 DIAGNOSIS — R69 Illness, unspecified: Secondary | ICD-10-CM | POA: Diagnosis not present

## 2020-05-17 DIAGNOSIS — H524 Presbyopia: Secondary | ICD-10-CM | POA: Diagnosis not present

## 2020-07-22 DIAGNOSIS — E7800 Pure hypercholesterolemia, unspecified: Secondary | ICD-10-CM | POA: Diagnosis not present

## 2020-07-22 DIAGNOSIS — R972 Elevated prostate specific antigen [PSA]: Secondary | ICD-10-CM | POA: Diagnosis not present

## 2020-07-22 DIAGNOSIS — R946 Abnormal results of thyroid function studies: Secondary | ICD-10-CM | POA: Diagnosis not present

## 2020-07-22 DIAGNOSIS — R739 Hyperglycemia, unspecified: Secondary | ICD-10-CM | POA: Diagnosis not present

## 2020-07-22 DIAGNOSIS — I10 Essential (primary) hypertension: Secondary | ICD-10-CM | POA: Diagnosis not present

## 2020-07-29 DIAGNOSIS — Z79899 Other long term (current) drug therapy: Secondary | ICD-10-CM | POA: Diagnosis not present

## 2020-07-29 DIAGNOSIS — R7303 Prediabetes: Secondary | ICD-10-CM | POA: Diagnosis not present

## 2020-07-29 DIAGNOSIS — E785 Hyperlipidemia, unspecified: Secondary | ICD-10-CM | POA: Diagnosis not present

## 2020-07-29 DIAGNOSIS — R69 Illness, unspecified: Secondary | ICD-10-CM | POA: Diagnosis not present

## 2021-02-09 DIAGNOSIS — E785 Hyperlipidemia, unspecified: Secondary | ICD-10-CM | POA: Diagnosis not present

## 2021-02-09 DIAGNOSIS — R7303 Prediabetes: Secondary | ICD-10-CM | POA: Diagnosis not present

## 2021-02-09 DIAGNOSIS — Z79899 Other long term (current) drug therapy: Secondary | ICD-10-CM | POA: Diagnosis not present

## 2021-02-16 DIAGNOSIS — Z0000 Encounter for general adult medical examination without abnormal findings: Secondary | ICD-10-CM | POA: Diagnosis not present

## 2021-02-16 DIAGNOSIS — R69 Illness, unspecified: Secondary | ICD-10-CM | POA: Diagnosis not present

## 2021-02-16 DIAGNOSIS — E785 Hyperlipidemia, unspecified: Secondary | ICD-10-CM | POA: Diagnosis not present

## 2021-02-16 DIAGNOSIS — Z125 Encounter for screening for malignant neoplasm of prostate: Secondary | ICD-10-CM | POA: Diagnosis not present

## 2021-02-16 DIAGNOSIS — K5190 Ulcerative colitis, unspecified, without complications: Secondary | ICD-10-CM | POA: Diagnosis not present

## 2021-02-16 DIAGNOSIS — R7989 Other specified abnormal findings of blood chemistry: Secondary | ICD-10-CM | POA: Diagnosis not present

## 2021-04-04 DIAGNOSIS — M1990 Unspecified osteoarthritis, unspecified site: Secondary | ICD-10-CM | POA: Diagnosis not present

## 2021-04-04 DIAGNOSIS — R69 Illness, unspecified: Secondary | ICD-10-CM | POA: Diagnosis not present

## 2021-04-04 DIAGNOSIS — F325 Major depressive disorder, single episode, in full remission: Secondary | ICD-10-CM | POA: Diagnosis not present

## 2021-04-04 DIAGNOSIS — K5190 Ulcerative colitis, unspecified, without complications: Secondary | ICD-10-CM | POA: Diagnosis not present

## 2021-04-04 DIAGNOSIS — F419 Anxiety disorder, unspecified: Secondary | ICD-10-CM | POA: Diagnosis not present

## 2021-04-04 DIAGNOSIS — G8929 Other chronic pain: Secondary | ICD-10-CM | POA: Diagnosis not present

## 2021-04-04 DIAGNOSIS — K219 Gastro-esophageal reflux disease without esophagitis: Secondary | ICD-10-CM | POA: Diagnosis not present

## 2021-04-04 DIAGNOSIS — Z6830 Body mass index (BMI) 30.0-30.9, adult: Secondary | ICD-10-CM | POA: Diagnosis not present

## 2021-04-04 DIAGNOSIS — F840 Autistic disorder: Secondary | ICD-10-CM | POA: Diagnosis not present

## 2021-04-04 DIAGNOSIS — Z008 Encounter for other general examination: Secondary | ICD-10-CM | POA: Diagnosis not present

## 2021-04-04 DIAGNOSIS — E669 Obesity, unspecified: Secondary | ICD-10-CM | POA: Diagnosis not present

## 2021-04-04 DIAGNOSIS — J301 Allergic rhinitis due to pollen: Secondary | ICD-10-CM | POA: Diagnosis not present

## 2021-04-04 DIAGNOSIS — R030 Elevated blood-pressure reading, without diagnosis of hypertension: Secondary | ICD-10-CM | POA: Diagnosis not present

## 2021-04-04 DIAGNOSIS — E785 Hyperlipidemia, unspecified: Secondary | ICD-10-CM | POA: Diagnosis not present

## 2021-08-15 DIAGNOSIS — H5213 Myopia, bilateral: Secondary | ICD-10-CM | POA: Diagnosis not present

## 2021-08-15 DIAGNOSIS — Z0100 Encounter for examination of eyes and vision without abnormal findings: Secondary | ICD-10-CM | POA: Diagnosis not present

## 2021-08-18 DIAGNOSIS — Z0000 Encounter for general adult medical examination without abnormal findings: Secondary | ICD-10-CM | POA: Diagnosis not present

## 2021-08-18 DIAGNOSIS — R7989 Other specified abnormal findings of blood chemistry: Secondary | ICD-10-CM | POA: Diagnosis not present

## 2021-08-18 DIAGNOSIS — K5190 Ulcerative colitis, unspecified, without complications: Secondary | ICD-10-CM | POA: Diagnosis not present

## 2021-08-18 DIAGNOSIS — Z125 Encounter for screening for malignant neoplasm of prostate: Secondary | ICD-10-CM | POA: Diagnosis not present

## 2021-08-18 DIAGNOSIS — E785 Hyperlipidemia, unspecified: Secondary | ICD-10-CM | POA: Diagnosis not present

## 2021-08-25 DIAGNOSIS — R69 Illness, unspecified: Secondary | ICD-10-CM | POA: Diagnosis not present

## 2021-08-25 DIAGNOSIS — R946 Abnormal results of thyroid function studies: Secondary | ICD-10-CM | POA: Diagnosis not present

## 2021-08-25 DIAGNOSIS — K5190 Ulcerative colitis, unspecified, without complications: Secondary | ICD-10-CM | POA: Diagnosis not present

## 2021-08-25 DIAGNOSIS — E785 Hyperlipidemia, unspecified: Secondary | ICD-10-CM | POA: Diagnosis not present

## 2021-08-25 DIAGNOSIS — R739 Hyperglycemia, unspecified: Secondary | ICD-10-CM | POA: Diagnosis not present

## 2021-08-25 DIAGNOSIS — Z0000 Encounter for general adult medical examination without abnormal findings: Secondary | ICD-10-CM | POA: Diagnosis not present

## 2021-08-25 DIAGNOSIS — Z125 Encounter for screening for malignant neoplasm of prostate: Secondary | ICD-10-CM | POA: Diagnosis not present

## 2022-02-16 DIAGNOSIS — R739 Hyperglycemia, unspecified: Secondary | ICD-10-CM | POA: Diagnosis not present

## 2022-02-16 DIAGNOSIS — E785 Hyperlipidemia, unspecified: Secondary | ICD-10-CM | POA: Diagnosis not present

## 2022-02-16 DIAGNOSIS — Z0000 Encounter for general adult medical examination without abnormal findings: Secondary | ICD-10-CM | POA: Diagnosis not present

## 2022-02-16 DIAGNOSIS — R946 Abnormal results of thyroid function studies: Secondary | ICD-10-CM | POA: Diagnosis not present

## 2022-02-16 DIAGNOSIS — Z125 Encounter for screening for malignant neoplasm of prostate: Secondary | ICD-10-CM | POA: Diagnosis not present

## 2022-02-22 DIAGNOSIS — Z9109 Other allergy status, other than to drugs and biological substances: Secondary | ICD-10-CM | POA: Diagnosis not present

## 2022-02-22 DIAGNOSIS — E785 Hyperlipidemia, unspecified: Secondary | ICD-10-CM | POA: Diagnosis not present

## 2022-02-22 DIAGNOSIS — R69 Illness, unspecified: Secondary | ICD-10-CM | POA: Diagnosis not present

## 2022-02-22 DIAGNOSIS — Z0000 Encounter for general adult medical examination without abnormal findings: Secondary | ICD-10-CM | POA: Diagnosis not present

## 2022-02-22 DIAGNOSIS — J988 Other specified respiratory disorders: Secondary | ICD-10-CM | POA: Diagnosis not present

## 2022-02-22 DIAGNOSIS — R739 Hyperglycemia, unspecified: Secondary | ICD-10-CM | POA: Diagnosis not present

## 2022-04-25 ENCOUNTER — Encounter: Payer: Self-pay | Admitting: Gastroenterology

## 2022-05-01 ENCOUNTER — Encounter: Payer: Self-pay | Admitting: Gastroenterology

## 2022-05-04 ENCOUNTER — Ambulatory Visit (AMBULATORY_SURGERY_CENTER): Payer: Self-pay

## 2022-05-04 VITALS — Ht 74.0 in | Wt 242.0 lb

## 2022-05-04 DIAGNOSIS — Z1211 Encounter for screening for malignant neoplasm of colon: Secondary | ICD-10-CM

## 2022-05-04 DIAGNOSIS — K51919 Ulcerative colitis, unspecified with unspecified complications: Secondary | ICD-10-CM

## 2022-05-04 MED ORDER — NA SULFATE-K SULFATE-MG SULF 17.5-3.13-1.6 GM/177ML PO SOLN: 1.0000 | Freq: Once | ORAL | 0 refills | Status: AC

## 2022-05-04 NOTE — Progress Notes (Signed)

## 2022-05-10 DIAGNOSIS — K5190 Ulcerative colitis, unspecified, without complications: Secondary | ICD-10-CM | POA: Diagnosis not present

## 2022-05-10 DIAGNOSIS — M1990 Unspecified osteoarthritis, unspecified site: Secondary | ICD-10-CM | POA: Diagnosis not present

## 2022-05-10 DIAGNOSIS — R69 Illness, unspecified: Secondary | ICD-10-CM | POA: Diagnosis not present

## 2022-05-10 DIAGNOSIS — I10 Essential (primary) hypertension: Secondary | ICD-10-CM | POA: Diagnosis not present

## 2022-05-10 DIAGNOSIS — Z8249 Family history of ischemic heart disease and other diseases of the circulatory system: Secondary | ICD-10-CM | POA: Diagnosis not present

## 2022-05-10 DIAGNOSIS — K219 Gastro-esophageal reflux disease without esophagitis: Secondary | ICD-10-CM | POA: Diagnosis not present

## 2022-05-10 DIAGNOSIS — E669 Obesity, unspecified: Secondary | ICD-10-CM | POA: Diagnosis not present

## 2022-05-10 DIAGNOSIS — Z6830 Body mass index (BMI) 30.0-30.9, adult: Secondary | ICD-10-CM | POA: Diagnosis not present

## 2022-05-10 DIAGNOSIS — E785 Hyperlipidemia, unspecified: Secondary | ICD-10-CM | POA: Diagnosis not present

## 2022-05-10 DIAGNOSIS — Z823 Family history of stroke: Secondary | ICD-10-CM | POA: Diagnosis not present

## 2022-05-10 DIAGNOSIS — H269 Unspecified cataract: Secondary | ICD-10-CM | POA: Diagnosis not present

## 2022-06-01 ENCOUNTER — Encounter: Payer: Self-pay | Admitting: Gastroenterology

## 2022-06-03 ENCOUNTER — Encounter: Payer: Self-pay | Admitting: Certified Registered Nurse Anesthetist

## 2022-06-06 ENCOUNTER — Ambulatory Visit (AMBULATORY_SURGERY_CENTER): Payer: Medicare HMO | Admitting: Gastroenterology

## 2022-06-06 ENCOUNTER — Encounter: Payer: Self-pay | Admitting: Gastroenterology

## 2022-06-06 VITALS — BP 133/99 | HR 63 | Temp 97.1°F | Resp 7 | Ht 74.0 in | Wt 242.0 lb

## 2022-06-06 DIAGNOSIS — D128 Benign neoplasm of rectum: Secondary | ICD-10-CM | POA: Diagnosis not present

## 2022-06-06 DIAGNOSIS — Z09 Encounter for follow-up examination after completed treatment for conditions other than malignant neoplasm: Secondary | ICD-10-CM | POA: Diagnosis not present

## 2022-06-06 DIAGNOSIS — K51919 Ulcerative colitis, unspecified with unspecified complications: Secondary | ICD-10-CM | POA: Diagnosis not present

## 2022-06-06 DIAGNOSIS — K621 Rectal polyp: Secondary | ICD-10-CM | POA: Diagnosis not present

## 2022-06-06 DIAGNOSIS — Z1211 Encounter for screening for malignant neoplasm of colon: Secondary | ICD-10-CM

## 2022-06-06 DIAGNOSIS — R69 Illness, unspecified: Secondary | ICD-10-CM | POA: Diagnosis not present

## 2022-06-06 DIAGNOSIS — Z86010 Personal history of colonic polyps: Secondary | ICD-10-CM | POA: Diagnosis not present

## 2022-06-06 MED ORDER — SODIUM CHLORIDE 0.9 % IV SOLN: 500.0000 mL | Freq: Once | INTRAVENOUS | Status: DC

## 2022-06-06 NOTE — Progress Notes (Signed)
Report given to PACU, vss 

## 2022-06-06 NOTE — Progress Notes (Signed)
Called to room to assist during endoscopic procedure.  Patient ID and intended procedure confirmed with present staff. Received instructions for my participation in the procedure from the performing physician.  

## 2022-06-06 NOTE — Progress Notes (Signed)
History & Physical  Primary Care Physician:  Janie Morning, DO Primary Gastroenterologist: Lucio Edward, MD  CHIEF COMPLAINT:  Ulcerative colitis  HPI: Scott Armstrong is a 71 y.o. male with a history of ulcerative colitis for surveillance colonoscopy.   Past Medical History:  Diagnosis Date   Allergy    Anxiety    Arthritis    Aspergers' syndrome    Cataract    left eye   Depression    Insomnia    Seasonal allergies    Seasonal allergies    Ulcerative colitis [facility])     Past Surgical History:  Procedure Laterality Date   COLONOSCOPY  last 02/04/2016   HERNIA REPAIR     POLYPECTOMY     TONSILLECTOMY     WRIST FRACTURE SURGERY Right     Prior to Admission medications   Medication Sig Start Date End Date Taking? Authorizing Provider  DELZICOL 400 MG CPDR DR capsule Take 400 mg by mouth 3 (three) times daily. 08/31/14  Yes [provider]  escitalopram (LEXAPRO) 10 MG tablet  07/21/14  Yes [provider]  Multiple Vitamin (MULTIVITAMIN WITH MINERALS) TABS tablet Take 1 tablet by mouth daily.   Yes [provider]  Red Yeast Rice 55 MG CAPS Red Yeast Rice   Yes [provider]  loperamide (IMODIUM A-D) 2 MG tablet Take 2 mg by mouth 4 (four) times daily as needed for diarrhea or loose stools. Patient not taking: Reported on 05/04/2022    [provider]  loratadine (CLARITIN) 10 MG tablet 1 tablet Orally Once a day    [provider]    Current Outpatient Medications  Medication Sig Dispense Refill   DELZICOL 400 MG CPDR DR capsule Take 400 mg by mouth 3 (three) times daily.  11   escitalopram (LEXAPRO) 10 MG tablet   3   Multiple Vitamin (MULTIVITAMIN WITH MINERALS) TABS tablet Take 1 tablet by mouth daily.     Red Yeast Rice 55 MG CAPS Red Yeast Rice     loperamide (IMODIUM A-D) 2 MG tablet Take 2 mg by mouth 4 (four) times daily as needed for diarrhea or loose stools. (Patient not taking: Reported on 05/04/2022)      loratadine (CLARITIN) 10 MG tablet 1 tablet Orally Once a day     Current Facility-Administered Medications  Medication Dose Route Frequency Provider Last Rate Last Admin   0.9 %  sodium chloride infusion  500 mL Intravenous Once Ladene Artist, MD        Allergies as of 06/06/2022   (No Known Allergies)    Family History  Problem Relation Age of Onset   Gout Father    Renal Disease Father    Hypertension Father    Prostate cancer Father    Alzheimer's disease Mother    Heart attack Paternal Grandmother    Heart attack Paternal Aunt    Hypertension Sister    Colon cancer Neg Hx    Esophageal cancer Neg Hx    Rectal cancer Neg Hx    Stomach cancer Neg Hx    Colon polyps Neg Hx     Social History   Socioeconomic History   Marital status: Single    Spouse name: Not on file   Number of children: 0   Years of education: Not on file   Highest education level: Not on file  Occupational History   Occupation: retired Pharmacist, hospital  Tobacco Use   Smoking status: Former  Years: 3.00    Types: Cigarettes    Quit date: 07/04/1971    Years since quitting: 50.9   Smokeless tobacco: Never   Tobacco comments:    college years  Vaping Use   Vaping Use: Never used  Substance and Sexual Activity   Alcohol use: Yes    Alcohol/week: 21.0 standard drinks of alcohol    Types: 14 Cans of beer, 7 Standard drinks or equivalent per week    Comment: 2 beers and vodoka every day per pt   Drug use: No   Sexual activity: Not on file  Other Topics Concern   Not on file  Social History Narrative   Not on file   Social Determinants of Health   Financial Resource Strain: Not on file  Food Insecurity: Not on file  Transportation Needs: Not on file  Physical Activity: Not on file  Stress: Not on file  Social Connections: Not on file  Intimate Partner Violence: Not on file    Review of Systems:  All systems reviewed were negative except where noted in HPI.   Physical  Exam: General:  Alert, well-developed, in NAD Head:  Normocephalic and atraumatic. Eyes:  Sclera clear, no icterus.   Conjunctiva pink. Ears:  Normal auditory acuity. Mouth:  No deformity or lesions.  Neck:  Supple; no masses . Lungs:  Clear throughout to auscultation.   No wheezes, crackles, or rhonchi. No acute distress. Heart:  Regular rate and rhythm; no murmurs. Abdomen:  Soft, nondistended, nontender. No masses, hepatomegaly. No obvious masses.  Normal bowel .    Rectal:  Deferred   Msk:  Symmetrical without gross deformities.. Pulses:  Normal pulses noted. Extremities:  Without edema. Neurologic:  Alert and  oriented x4;  grossly normal neurologically. Skin:  Intact without significant lesions or rashes. Psych:  Alert and cooperative. Normal mood and affect.  Impression / Plan:   Personal history of ulcerative colitis for surveillance colonoscopy.   Pricilla Riffle. Fuller Plan  06/06/2022, 10:49 AM See Shea Evans, [facility], to contact our on call provider

## 2022-06-06 NOTE — Op Note (Signed)
[facility] Patient Name: Scott Armstrong Procedure Date: 06/06/2022 11:02 AM MRN: [id number] Endoscopist: Ladene Artist , MD, [id number] Age: 71 Referring MD:  Date of Birth: 1950-08-15 Gender: Male Account #: [id number] Procedure:                Colonoscopy Indications:              High risk colon cancer surveillance: Ulcerative                            left sided colitis of 8 (or more) years duration Medicines:                Monitored Anesthesia Care Procedure:                Pre-Anesthesia Assessment:                           - Prior to the procedure, a History and Physical                            was performed, and patient medications and                            allergies were reviewed. The patient's tolerance of                            previous anesthesia was also reviewed. The risks                            and benefits of the procedure and the sedation                            options and risks were discussed with the patient.                            All questions were answered, and informed consent                            was obtained. Prior Anticoagulants: The patient has                            taken no anticoagulant or antiplatelet agents. ASA                            Grade Assessment: II - A patient with mild systemic                            disease. After reviewing the risks and benefits,                            the patient was deemed in satisfactory condition to                            undergo the procedure.  After obtaining informed consent, the colonoscope                            was passed under direct vision. Throughout the                            procedure, the patient's blood pressure, pulse, and                            oxygen saturations were monitored continuously. The                            CF [id number] #[id number] was introduced through the anus                            and  advanced to the the cecum, identified by                            appendiceal orifice and ileocecal valve. The                            ileocecal valve, appendiceal orifice, and rectum                            were photographed. The quality of the bowel                            preparation was adequate. The colonoscopy was                            performed without difficulty. The patient tolerated                            the procedure well. Scope In: 11:05:41 AM Scope Out: 11:19:22 AM Scope Withdrawal Time: 0 hours 11 minutes 21 seconds  Total Procedure Duration: 0 hours 13 minutes 41 seconds  Findings:                 The perianal and digital rectal examinations were                            normal.                           Multiple small-mouthed diverticula were found in                            the right colon. There was no evidence of                            diverticular bleeding.                           Many medium-mouthed diverticula were found in the  left colon. There was narrowing of the colon in                            association with the diverticular opening. There                            was evidence of diverticular spasm. There was no                            evidence of diverticular bleeding.                           A 7 mm polyp was found in the rectum. The polyp was                            sessile. The polyp was removed with a cold snare.                            Resection and retrieval were complete.                           The exam was otherwise without abnormality on                            direct and retroflexion views. Random biospies                            obtained throughtout. Complications:            No immediate complications. Estimated blood loss:                            None. Estimated Blood Loss:     Estimated blood loss: none. Impression:               - Mild diverticulosis in the  right colon.                           - Moderate diverticulosis in the left colon.                           - One 7 mm polyp in the rectum, removed with a cold                            snare. Resected and retrieved.                           - The examination was otherwise normal on direct                            and retroflexion views. Biopsied. Recommendation:           - Repeat colonoscopy vs no repeat due to age after  studies are complete for surveillance based on                            pathology results.                           - Patient has a contact number available for                            emergencies. The signs and symptoms of potential                            delayed complications were discussed with the                            patient. Return to normal activities tomorrow.                            Written discharge instructions were provided to the                            patient.                           - High fiber diet.                           - Continue present medications.                           - Await pathology results. Ladene Artist, MD 06/06/2022 11:23:49 AM This report has been signed electronically.

## 2022-06-06 NOTE — Progress Notes (Signed)
VS completed by CW.   Pt's states no medical or surgical changes since previsit or office visit.  

## 2022-06-06 NOTE — Progress Notes (Signed)
   Established Patient Office Visit  Subjective   Patient ID: Scott Armstrong, male    DOB: 12-09-1950  Age: 71 y.o. MRN: [id number]  Chief Complaint  Patient presents with   Colonoscopy    Screening/ulcerative colitis/[place]    HPI    ROS    Objective:     BP 118/76   Pulse 65   Temp (!) 97.1 F (36.2 C) (Temporal)   Resp 15   Ht '6\' 2"'$  (1.88 m)   Wt 242 lb (109.8 kg)   SpO2 93%   BMI 31.07 kg/m    Physical Exam   No results found for any visits on 06/06/22.    The ASCVD Risk score (Arnett DK, et al., 2019) failed to calculate for the following reasons:   Cannot find a previous HDL lab   Cannot find a previous total cholesterol lab    Assessment & Plan:   Problem List Items Addressed This Visit       Digestive   Ulcerative colitis ([place])   Relevant Medications   0.9 %  sodium chloride infusion   Other Relevant Orders   Diet NPO   Hypoglycemia/Hyperglycemia protocol   Emergency Medications   ECG and pulse monitoring    Monitor NIBP   Notify physician   Monitor O2 SATs   NPO status   Vital signs   Monitor NIBP, ECG and PSA02   Notify physician   Discharge home with responsible adult.   Discharge instructions   Colonoscopy: verify informed consent   Other Visit Diagnoses     Special screening for malignant neoplasms, colon    -  Primary   Relevant Medications   0.9 %  sodium chloride infusion   Other Relevant Orders   Diet NPO   Hypoglycemia/Hyperglycemia protocol   Emergency Medications   ECG and pulse monitoring    Monitor NIBP   Notify physician   Monitor O2 SATs   NPO status   Vital signs   Monitor NIBP, ECG and PSA02   Notify physician   Discharge home with responsible adult.   Discharge instructions   Colonoscopy: verify informed consent   Benign neoplasm of rectum           No follow-ups on file.    Collier Bullock, CRNA

## 2022-06-06 NOTE — Patient Instructions (Signed)
YOU HAD AN ENDOSCOPIC PROCEDURE TODAY AT [facility]:   Refer to the procedure report that was given to you for any specific questions about what was found during the examination.  If the procedure report does not answer your questions, please call your gastroenterologist to clarify.  If you requested that your care partner not be given the details of your procedure findings, then the procedure report has been included in a sealed envelope for you to review at your convenience later.  **Handouts given on polyps, diverticulosis and high fiber diet**  YOU SHOULD EXPECT: Some feelings of bloating in the abdomen. Passage of more gas than usual.  Walking can help get rid of the air that was put into your GI tract during the procedure and reduce the bloating. If you had a lower endoscopy (such as a colonoscopy or flexible sigmoidoscopy) you may notice spotting of blood in your stool or on the toilet paper. If you underwent a bowel prep for your procedure, you may not have a normal bowel movement for a few days.  Please Note:  You might notice some irritation and congestion in your nose or some drainage.  This is from the oxygen used during your procedure.  There is no need for concern and it should clear up in a day or so.  SYMPTOMS TO REPORT IMMEDIATELY:  Following lower endoscopy (colonoscopy or flexible sigmoidoscopy):  Excessive amounts of blood in the stool  Significant tenderness or worsening of abdominal pains  Swelling of the abdomen that is new, acute  Fever of 100F or higher  For urgent or emergent issues, a gastroenterologist can be reached at any hour by calling [phone]. Do not use MyChart messaging for urgent concerns.    DIET:  We do recommend a small meal at first, but then you may proceed to your regular diet.  Drink plenty of fluids but you should avoid alcoholic beverages for 24 hours.  ACTIVITY:  You should plan to take it easy for the rest of today and you  should NOT DRIVE or use heavy machinery until tomorrow (because of the sedation medicines used during the test).    FOLLOW UP: Our staff will call the number listed on your records the next business day following your procedure.  We will call around 7:15- 8:00 am to check on you and address any questions or concerns that you may have regarding the information given to you following your procedure. If we do not reach you, we will leave a message.     If any biopsies were taken you will be contacted by phone or by letter within the next 1-3 weeks.  Please call us at [phone] if you have not heard about the biopsies in 3 weeks.    SIGNATURES/CONFIDENTIALITY: You and/or your care partner have signed paperwork which will be entered into your electronic medical record.  These signatures attest to the fact that that the information above on your After Visit Summary has been reviewed and is understood.  Full responsibility of the confidentiality of this discharge information lies with you and/or your care-partner.

## 2022-06-07 ENCOUNTER — Telehealth: Payer: Self-pay

## 2022-06-07 NOTE — Telephone Encounter (Signed)
Left message on follow up call. 

## 2022-06-15 ENCOUNTER — Encounter: Payer: Self-pay | Admitting: Gastroenterology

## 2022-08-21 DIAGNOSIS — E785 Hyperlipidemia, unspecified: Secondary | ICD-10-CM | POA: Diagnosis not present

## 2022-08-21 DIAGNOSIS — R739 Hyperglycemia, unspecified: Secondary | ICD-10-CM | POA: Diagnosis not present

## 2022-08-28 DIAGNOSIS — E785 Hyperlipidemia, unspecified: Secondary | ICD-10-CM | POA: Diagnosis not present

## 2022-08-28 DIAGNOSIS — Z125 Encounter for screening for malignant neoplasm of prostate: Secondary | ICD-10-CM | POA: Diagnosis not present

## 2022-08-28 DIAGNOSIS — R946 Abnormal results of thyroid function studies: Secondary | ICD-10-CM | POA: Diagnosis not present

## 2022-08-28 DIAGNOSIS — Z9109 Other allergy status, other than to drugs and biological substances: Secondary | ICD-10-CM | POA: Diagnosis not present

## 2022-08-28 DIAGNOSIS — L219 Seborrheic dermatitis, unspecified: Secondary | ICD-10-CM | POA: Diagnosis not present

## 2022-08-28 DIAGNOSIS — F419 Anxiety disorder, unspecified: Secondary | ICD-10-CM | POA: Diagnosis not present

## 2022-08-28 DIAGNOSIS — R69 Illness, unspecified: Secondary | ICD-10-CM | POA: Diagnosis not present

## 2022-08-28 DIAGNOSIS — Z79899 Other long term (current) drug therapy: Secondary | ICD-10-CM | POA: Diagnosis not present

## 2022-08-28 DIAGNOSIS — F845 Asperger's syndrome: Secondary | ICD-10-CM | POA: Diagnosis not present

## 2022-08-28 DIAGNOSIS — B372 Candidiasis of skin and nail: Secondary | ICD-10-CM | POA: Diagnosis not present

## 2022-08-28 DIAGNOSIS — R7309 Other abnormal glucose: Secondary | ICD-10-CM | POA: Diagnosis not present

## 2022-09-12 DIAGNOSIS — H5203 Hypermetropia, bilateral: Secondary | ICD-10-CM | POA: Diagnosis not present

## 2022-11-30 DIAGNOSIS — M25552 Pain in left hip: Secondary | ICD-10-CM | POA: Diagnosis not present

## 2022-11-30 DIAGNOSIS — M25562 Pain in left knee: Secondary | ICD-10-CM | POA: Diagnosis not present

## 2022-11-30 DIAGNOSIS — M25551 Pain in right hip: Secondary | ICD-10-CM | POA: Diagnosis not present

## 2022-11-30 DIAGNOSIS — M25561 Pain in right knee: Secondary | ICD-10-CM | POA: Diagnosis not present

## 2023-01-31 DIAGNOSIS — L309 Dermatitis, unspecified: Secondary | ICD-10-CM | POA: Diagnosis not present

## 2023-01-31 DIAGNOSIS — Z87891 Personal history of nicotine dependence: Secondary | ICD-10-CM | POA: Diagnosis not present

## 2023-01-31 DIAGNOSIS — E669 Obesity, unspecified: Secondary | ICD-10-CM | POA: Diagnosis not present

## 2023-01-31 DIAGNOSIS — Z008 Encounter for other general examination: Secondary | ICD-10-CM | POA: Diagnosis not present

## 2023-01-31 DIAGNOSIS — F419 Anxiety disorder, unspecified: Secondary | ICD-10-CM | POA: Diagnosis not present

## 2023-01-31 DIAGNOSIS — J301 Allergic rhinitis due to pollen: Secondary | ICD-10-CM | POA: Diagnosis not present

## 2023-01-31 DIAGNOSIS — K219 Gastro-esophageal reflux disease without esophagitis: Secondary | ICD-10-CM | POA: Diagnosis not present

## 2023-01-31 DIAGNOSIS — F325 Major depressive disorder, single episode, in full remission: Secondary | ICD-10-CM | POA: Diagnosis not present

## 2023-01-31 DIAGNOSIS — M1990 Unspecified osteoarthritis, unspecified site: Secondary | ICD-10-CM | POA: Diagnosis not present

## 2023-01-31 DIAGNOSIS — Z809 Family history of malignant neoplasm, unspecified: Secondary | ICD-10-CM | POA: Diagnosis not present

## 2023-01-31 DIAGNOSIS — E785 Hyperlipidemia, unspecified: Secondary | ICD-10-CM | POA: Diagnosis not present

## 2023-01-31 DIAGNOSIS — Z8249 Family history of ischemic heart disease and other diseases of the circulatory system: Secondary | ICD-10-CM | POA: Diagnosis not present

## 2023-01-31 DIAGNOSIS — Z823 Family history of stroke: Secondary | ICD-10-CM | POA: Diagnosis not present

## 2023-02-19 DIAGNOSIS — R7309 Other abnormal glucose: Secondary | ICD-10-CM | POA: Diagnosis not present

## 2023-02-19 DIAGNOSIS — Z79899 Other long term (current) drug therapy: Secondary | ICD-10-CM | POA: Diagnosis not present

## 2023-02-19 DIAGNOSIS — R946 Abnormal results of thyroid function studies: Secondary | ICD-10-CM | POA: Diagnosis not present

## 2023-02-19 DIAGNOSIS — E785 Hyperlipidemia, unspecified: Secondary | ICD-10-CM | POA: Diagnosis not present

## 2023-02-19 DIAGNOSIS — Z125 Encounter for screening for malignant neoplasm of prostate: Secondary | ICD-10-CM | POA: Diagnosis not present

## 2023-02-26 DIAGNOSIS — Z79899 Other long term (current) drug therapy: Secondary | ICD-10-CM | POA: Diagnosis not present

## 2023-02-26 DIAGNOSIS — E785 Hyperlipidemia, unspecified: Secondary | ICD-10-CM | POA: Diagnosis not present

## 2023-02-26 DIAGNOSIS — F845 Asperger's syndrome: Secondary | ICD-10-CM | POA: Diagnosis not present

## 2023-02-26 DIAGNOSIS — R7309 Other abnormal glucose: Secondary | ICD-10-CM | POA: Diagnosis not present

## 2023-02-26 DIAGNOSIS — L219 Seborrheic dermatitis, unspecified: Secondary | ICD-10-CM | POA: Diagnosis not present

## 2023-02-26 DIAGNOSIS — F419 Anxiety disorder, unspecified: Secondary | ICD-10-CM | POA: Diagnosis not present

## 2023-02-26 DIAGNOSIS — Z9109 Other allergy status, other than to drugs and biological substances: Secondary | ICD-10-CM | POA: Diagnosis not present

## 2023-02-26 DIAGNOSIS — Z0000 Encounter for general adult medical examination without abnormal findings: Secondary | ICD-10-CM | POA: Diagnosis not present

## 2023-02-26 DIAGNOSIS — K5190 Ulcerative colitis, unspecified, without complications: Secondary | ICD-10-CM | POA: Diagnosis not present

## 2023-08-23 DIAGNOSIS — R7309 Other abnormal glucose: Secondary | ICD-10-CM | POA: Diagnosis not present

## 2023-08-23 DIAGNOSIS — E785 Hyperlipidemia, unspecified: Secondary | ICD-10-CM | POA: Diagnosis not present

## 2023-08-23 DIAGNOSIS — Z79899 Other long term (current) drug therapy: Secondary | ICD-10-CM | POA: Diagnosis not present

## 2023-08-30 DIAGNOSIS — Z9109 Other allergy status, other than to drugs and biological substances: Secondary | ICD-10-CM | POA: Diagnosis not present

## 2023-08-30 DIAGNOSIS — F845 Asperger's syndrome: Secondary | ICD-10-CM | POA: Diagnosis not present

## 2023-08-30 DIAGNOSIS — E785 Hyperlipidemia, unspecified: Secondary | ICD-10-CM | POA: Diagnosis not present

## 2023-08-30 DIAGNOSIS — F419 Anxiety disorder, unspecified: Secondary | ICD-10-CM | POA: Diagnosis not present

## 2023-08-30 DIAGNOSIS — R946 Abnormal results of thyroid function studies: Secondary | ICD-10-CM | POA: Diagnosis not present

## 2023-08-30 DIAGNOSIS — R7309 Other abnormal glucose: Secondary | ICD-10-CM | POA: Diagnosis not present

## 2023-08-30 DIAGNOSIS — K5190 Ulcerative colitis, unspecified, without complications: Secondary | ICD-10-CM | POA: Diagnosis not present

## 2023-08-30 DIAGNOSIS — Z79899 Other long term (current) drug therapy: Secondary | ICD-10-CM | POA: Diagnosis not present

## 2023-09-06 DIAGNOSIS — Z8249 Family history of ischemic heart disease and other diseases of the circulatory system: Secondary | ICD-10-CM | POA: Diagnosis not present

## 2023-09-06 DIAGNOSIS — Z809 Family history of malignant neoplasm, unspecified: Secondary | ICD-10-CM | POA: Diagnosis not present

## 2023-09-06 DIAGNOSIS — M1990 Unspecified osteoarthritis, unspecified site: Secondary | ICD-10-CM | POA: Diagnosis not present

## 2023-09-06 DIAGNOSIS — Z6831 Body mass index (BMI) 31.0-31.9, adult: Secondary | ICD-10-CM | POA: Diagnosis not present

## 2023-09-06 DIAGNOSIS — Z87891 Personal history of nicotine dependence: Secondary | ICD-10-CM | POA: Diagnosis not present

## 2023-09-06 DIAGNOSIS — H269 Unspecified cataract: Secondary | ICD-10-CM | POA: Diagnosis not present

## 2023-09-06 DIAGNOSIS — K5190 Ulcerative colitis, unspecified, without complications: Secondary | ICD-10-CM | POA: Diagnosis not present

## 2023-09-06 DIAGNOSIS — E669 Obesity, unspecified: Secondary | ICD-10-CM | POA: Diagnosis not present

## 2023-09-06 DIAGNOSIS — Z823 Family history of stroke: Secondary | ICD-10-CM | POA: Diagnosis not present

## 2023-09-06 DIAGNOSIS — F325 Major depressive disorder, single episode, in full remission: Secondary | ICD-10-CM | POA: Diagnosis not present

## 2023-09-06 DIAGNOSIS — F419 Anxiety disorder, unspecified: Secondary | ICD-10-CM | POA: Diagnosis not present

## 2023-09-06 DIAGNOSIS — N182 Chronic kidney disease, stage 2 (mild): Secondary | ICD-10-CM | POA: Diagnosis not present

## 2023-10-11 ENCOUNTER — Ambulatory Visit: Admitting: Dermatology

## 2023-10-11 ENCOUNTER — Encounter: Payer: Self-pay | Admitting: Dermatology

## 2023-10-11 VITALS — BP 154/95 | HR 100

## 2023-10-11 DIAGNOSIS — W908XXA Exposure to other nonionizing radiation, initial encounter: Secondary | ICD-10-CM

## 2023-10-11 DIAGNOSIS — L2389 Allergic contact dermatitis due to other agents: Secondary | ICD-10-CM | POA: Diagnosis not present

## 2023-10-11 DIAGNOSIS — L219 Seborrheic dermatitis, unspecified: Secondary | ICD-10-CM | POA: Diagnosis not present

## 2023-10-11 DIAGNOSIS — L820 Inflamed seborrheic keratosis: Secondary | ICD-10-CM

## 2023-10-11 DIAGNOSIS — D229 Melanocytic nevi, unspecified: Secondary | ICD-10-CM

## 2023-10-11 DIAGNOSIS — L578 Other skin changes due to chronic exposure to nonionizing radiation: Secondary | ICD-10-CM | POA: Diagnosis not present

## 2023-10-11 DIAGNOSIS — L821 Other seborrheic keratosis: Secondary | ICD-10-CM | POA: Diagnosis not present

## 2023-10-11 DIAGNOSIS — Z1283 Encounter for screening for malignant neoplasm of skin: Secondary | ICD-10-CM | POA: Diagnosis not present

## 2023-10-11 DIAGNOSIS — L259 Unspecified contact dermatitis, unspecified cause: Secondary | ICD-10-CM

## 2023-10-11 DIAGNOSIS — Z808 Family history of malignant neoplasm of other organs or systems: Secondary | ICD-10-CM | POA: Diagnosis not present

## 2023-10-11 DIAGNOSIS — D485 Neoplasm of uncertain behavior of skin: Secondary | ICD-10-CM

## 2023-10-11 DIAGNOSIS — D1801 Hemangioma of skin and subcutaneous tissue: Secondary | ICD-10-CM | POA: Diagnosis not present

## 2023-10-11 DIAGNOSIS — D492 Neoplasm of unspecified behavior of bone, soft tissue, and skin: Secondary | ICD-10-CM | POA: Diagnosis not present

## 2023-10-11 DIAGNOSIS — L814 Other melanin hyperpigmentation: Secondary | ICD-10-CM | POA: Diagnosis not present

## 2023-10-11 MED ORDER — FLUOCINONIDE 0.05 % EX SOLN: 1.0000 | Freq: Two times a day (BID) | CUTANEOUS | 3 refills | Status: AC | PRN

## 2023-10-11 MED ORDER — KETOCONAZOLE 2 % EX SHAM: 1.0000 | MEDICATED_SHAMPOO | Freq: Once | CUTANEOUS | 5 refills | Status: AC

## 2023-10-11 MED ORDER — KETOCONAZOLE 2 % EX CREA: 1.0000 | TOPICAL_CREAM | Freq: Two times a day (BID) | CUTANEOUS | 4 refills | Status: DC

## 2023-10-11 MED ORDER — TRIAMCINOLONE ACETONIDE 0.1 % EX CREA: 1.0000 | TOPICAL_CREAM | Freq: Two times a day (BID) | CUTANEOUS | 2 refills | Status: AC

## 2023-10-11 NOTE — Patient Instructions (Addendum)

## 2023-10-11 NOTE — Progress Notes (Unsigned)
 New Patient Visit   Subjective  Scott Armstrong is a 73 y.o. male who presents for the following: Skin Cancer Screening and Upper Body Skin Exam. No hx of skin cancer. Family hx of skin cancer.  The patient presents for Upper-Body Skin Exam (UBSE) for skin cancer screening and mole check. The patient has spots, moles and lesions to be evaluated, some may be new or changing.  The following portions of the chart were reviewed this encounter and updated as appropriate: medications, allergies, medical history  Review of Systems:  No other skin or systemic complaints except as noted in HPI or Assessment and Plan.  Objective  Well appearing patient in no apparent distress; mood and affect are within normal limits.  A full examination was performed including scalp, head, eyes, ears, nose, lips, neck, chest, axillae, abdomen, back, bilateral upper extremities, hands, feet, fingers, and fingernail. All findings within normal limits unless otherwise noted below.   Relevant physical exam findings are noted in the Assessment and Plan.  Left Anterior Neck 5 mm pink brown papule  Mid Back 8 mm dark brown papule   Assessment & Plan   SKIN CANCER SCREENING PERFORMED TODAY.  ACTINIC DAMAGE - Chronic condition, secondary to cumulative UV/sun exposure - diffuse scaly erythematous macules with underlying dyspigmentation - Recommend daily broad spectrum sunscreen SPF 30+ to sun-exposed areas, reapply every 2 hours as needed.  - Staying in the shade or wearing long sleeves, sun glasses (UVA+UVB protection) and wide brim hats (4-inch brim around the entire circumference of the hat) are also recommended for sun protection.  - Call for new or changing lesions.  LENTIGINES, SEBORRHEIC KERATOSES, HEMANGIOMAS - Benign normal skin lesions - Benign-appearing - Call for any changes  MELANOCYTIC NEVI - Tan-brown and/or pink-flesh-colored symmetric macules and papules - Benign appearing on exam  today - Observation - Call clinic for new or changing moles - Recommend daily use of broad spectrum spf 30+ sunscreen to sun-exposed areas.   SEBORRHEIC DERMATITIS Exam: Pink patches with greasy scale at face, scalp, and central chest  Flared  Seborrheic Dermatitis is a chronic persistent rash characterized by pinkness and scaling most commonly of the mid face but also can occur on the scalp (dandruff), ears; mid chest, mid back and groin.  It tends to be exacerbated by stress and cooler weather.  People who have neurologic disease may experience new onset or exacerbation of existing seborrheic dermatitis.  The condition is not curable but treatable and can be controlled.  Treatment Plan: - Ketoconazole shampoo and cream daily - Lidex solution BID  ALLERGIC CONTACT DERMATITIS- bilateral shoulders Exam: scaly pink papules and/or plaques +/- vesiculation  Flared  Related to consistent use of heating pads  Treatment Plan: Triamcinolone 0.1% ointment BID  NEOPLASM OF UNCERTAIN BEHAVIOR OF SKIN (2) Left Anterior Neck Skin / nail biopsy Type of biopsy: tangential   Informed consent: discussed and consent obtained   Timeout: patient name, date of birth, surgical site, and procedure verified   Procedure prep:  Patient was prepped and draped in usual sterile fashion Prep type:  Isopropyl alcohol Anesthesia: the lesion was anesthetized in a standard fashion   Anesthetic:  1% lidocaine w/ epinephrine 1-100,000 buffered w/ 8.4% NaHCO3 Instrument used: DermaBlade   Hemostasis achieved with: aluminum chloride   Outcome: patient tolerated procedure well   Post-procedure details: sterile dressing applied and wound care instructions given   Dressing type: bandage and petrolatum   Specimen 1 - Surgical pathology Differential Diagnosis: r/o  bcc vs other  Check Margins: No Mid Back Skin / nail biopsy Type of biopsy: tangential   Informed consent: discussed and consent obtained   Timeout:  patient name, date of birth, surgical site, and procedure verified   Procedure prep:  Patient was prepped and draped in usual sterile fashion Prep type:  Isopropyl alcohol Anesthesia: the lesion was anesthetized in a standard fashion   Anesthetic:  1% lidocaine w/ epinephrine 1-100,000 buffered w/ 8.4% NaHCO3 Instrument used: DermaBlade   Hemostasis achieved with: aluminum chloride   Outcome: patient tolerated procedure well   Post-procedure details: sterile dressing applied and wound care instructions given   Dressing type: bandage and petrolatum   Specimen 2 - Surgical pathology Differential Diagnosis: r/o mm vs isk vs other  Check Margins: No SEBORRHEIC DERMATITIS Chest - Medial (Center), Left Breast, Left Forehead, Mid Forehead, Neck - Posterior, Right Breast, Right Forehead Related Medications ketoconazole (NIZORAL) 2 % shampoo Apply 1 Application topically once for 1 dose. fluocinonide (LIDEX) 0.05 % external solution Apply 1 Application topically 2 (two) times daily as needed. Apply chest, face and scalp CONTACT DERMATITIS, UNSPECIFIED CONTACT DERMATITIS TYPE, UNSPECIFIED TRIGGER Neck - Anterior, Neck - Posterior Related Medications triamcinolone cream (KENALOG) 0.1 % Apply 1 Application topically 2 (two) times daily. Apply to spots on shoulders 2x daily SEBORRHEIC KERATOSIS   LENTIGO   MULTIPLE BENIGN NEVI   CHERRY ANGIOMA    Return in about 1 year (around 10/10/2024) for [facility].  I, Manual Meier, Surg Tech III, am acting as scribe for Gwenith Daily, MD.   Documentation: I have reviewed the above documentation for accuracy and completeness, and I agree with the above.  Gwenith Daily, MD

## 2023-10-12 NOTE — Addendum Note (Signed)
 Addended by: Gwenith Daily on: 10/12/2023 11:19 AM   Modules accepted: Level of Service

## 2023-10-15 ENCOUNTER — Other Ambulatory Visit: Payer: Self-pay

## 2023-10-15 LAB — SURGICAL PATHOLOGY

## 2023-10-15 MED ORDER — KETOCONAZOLE 2 % EX CREA: 1.0000 | TOPICAL_CREAM | Freq: Two times a day (BID) | CUTANEOUS | 4 refills | Status: AC

## 2023-12-03 DIAGNOSIS — R58 Hemorrhage, not elsewhere classified: Secondary | ICD-10-CM | POA: Diagnosis not present

## 2023-12-03 DIAGNOSIS — K5190 Ulcerative colitis, unspecified, without complications: Secondary | ICD-10-CM | POA: Diagnosis not present

## 2024-02-22 DIAGNOSIS — E785 Hyperlipidemia, unspecified: Secondary | ICD-10-CM | POA: Diagnosis not present

## 2024-02-22 DIAGNOSIS — Z125 Encounter for screening for malignant neoplasm of prostate: Secondary | ICD-10-CM | POA: Diagnosis not present

## 2024-02-22 DIAGNOSIS — R946 Abnormal results of thyroid function studies: Secondary | ICD-10-CM | POA: Diagnosis not present

## 2024-02-22 DIAGNOSIS — R7309 Other abnormal glucose: Secondary | ICD-10-CM | POA: Diagnosis not present

## 2024-02-22 DIAGNOSIS — Z79899 Other long term (current) drug therapy: Secondary | ICD-10-CM | POA: Diagnosis not present

## 2024-02-29 DIAGNOSIS — Z9109 Other allergy status, other than to drugs and biological substances: Secondary | ICD-10-CM | POA: Diagnosis not present

## 2024-02-29 DIAGNOSIS — F845 Asperger's syndrome: Secondary | ICD-10-CM | POA: Diagnosis not present

## 2024-02-29 DIAGNOSIS — E785 Hyperlipidemia, unspecified: Secondary | ICD-10-CM | POA: Diagnosis not present

## 2024-02-29 DIAGNOSIS — F419 Anxiety disorder, unspecified: Secondary | ICD-10-CM | POA: Diagnosis not present

## 2024-02-29 DIAGNOSIS — J3489 Other specified disorders of nose and nasal sinuses: Secondary | ICD-10-CM | POA: Diagnosis not present

## 2024-02-29 DIAGNOSIS — R7309 Other abnormal glucose: Secondary | ICD-10-CM | POA: Diagnosis not present

## 2024-02-29 DIAGNOSIS — K5190 Ulcerative colitis, unspecified, without complications: Secondary | ICD-10-CM | POA: Diagnosis not present

## 2024-02-29 DIAGNOSIS — Z0000 Encounter for general adult medical examination without abnormal findings: Secondary | ICD-10-CM | POA: Diagnosis not present

## 2024-04-09 DIAGNOSIS — H5213 Myopia, bilateral: Secondary | ICD-10-CM | POA: Diagnosis not present

## 2024-05-16 DIAGNOSIS — R0981 Nasal congestion: Secondary | ICD-10-CM | POA: Diagnosis not present

## 2024-05-16 DIAGNOSIS — I4891 Unspecified atrial fibrillation: Secondary | ICD-10-CM | POA: Diagnosis not present

## 2024-05-16 DIAGNOSIS — J3489 Other specified disorders of nose and nasal sinuses: Secondary | ICD-10-CM | POA: Diagnosis not present

## 2024-05-20 DIAGNOSIS — R0981 Nasal congestion: Secondary | ICD-10-CM | POA: Diagnosis not present

## 2024-05-20 DIAGNOSIS — R9431 Abnormal electrocardiogram [ECG] [EKG]: Secondary | ICD-10-CM | POA: Diagnosis not present

## 2024-05-20 DIAGNOSIS — E7800 Pure hypercholesterolemia, unspecified: Secondary | ICD-10-CM | POA: Diagnosis not present

## 2024-05-20 DIAGNOSIS — I4891 Unspecified atrial fibrillation: Secondary | ICD-10-CM | POA: Diagnosis not present

## 2024-06-03 DIAGNOSIS — Z79899 Other long term (current) drug therapy: Secondary | ICD-10-CM | POA: Diagnosis not present

## 2024-06-03 DIAGNOSIS — I4891 Unspecified atrial fibrillation: Secondary | ICD-10-CM | POA: Diagnosis not present

## 2024-10-14 ENCOUNTER — Ambulatory Visit: Admitting: Dermatology
